# Patient Record
Sex: Female | Born: 1950 | Race: Black or African American | Hispanic: No | Marital: Married | Smoking: Never smoker
Health system: Southern US, Community
[De-identification: ages and names within clinical notes are randomized; demographics above are authoritative.]

## PROBLEM LIST (undated history)

## (undated) DIAGNOSIS — R011 Cardiac murmur, unspecified: Secondary | ICD-10-CM

## (undated) DIAGNOSIS — E7800 Pure hypercholesterolemia, unspecified: Secondary | ICD-10-CM

## (undated) DIAGNOSIS — J189 Pneumonia, unspecified organism: Secondary | ICD-10-CM

## (undated) DIAGNOSIS — D649 Anemia, unspecified: Secondary | ICD-10-CM

## (undated) DIAGNOSIS — E119 Type 2 diabetes mellitus without complications: Secondary | ICD-10-CM

## (undated) DIAGNOSIS — Z87442 Personal history of urinary calculi: Secondary | ICD-10-CM

## (undated) DIAGNOSIS — Z923 Personal history of irradiation: Secondary | ICD-10-CM

## (undated) HISTORY — PX: OVARIAN CYST REMOVAL: SHX89

---

## 1998-04-13 ENCOUNTER — Ambulatory Visit (HOSPITAL_COMMUNITY): Admission: RE | Admit: 1998-04-13 | Discharge: 1998-04-13 | Payer: Self-pay | Admitting: Family Medicine

## 2014-05-07 ENCOUNTER — Ambulatory Visit (INDEPENDENT_AMBULATORY_CARE_PROVIDER_SITE_OTHER): Payer: Self-pay | Admitting: Family Medicine

## 2014-05-07 VITALS — BP 118/72 | HR 79 | Temp 98.2°F | Resp 18 | Ht 63.0 in | Wt 189.8 lb

## 2014-05-07 DIAGNOSIS — J039 Acute tonsillitis, unspecified: Secondary | ICD-10-CM

## 2014-05-07 DIAGNOSIS — J029 Acute pharyngitis, unspecified: Secondary | ICD-10-CM

## 2014-05-07 MED ORDER — HYDROCODONE-ACETAMINOPHEN 7.5-325 MG/15ML PO SOLN
10.0000 mL | Freq: Four times a day (QID) | ORAL | Status: DC | PRN
Start: 2014-05-07 — End: 2020-04-09

## 2014-05-07 MED ORDER — AMOXICILLIN-POT CLAVULANATE 875-125 MG PO TABS: 1.0000 | ORAL_TABLET | Freq: Two times a day (BID) | ORAL | Status: DC

## 2014-05-07 NOTE — Progress Notes (Signed)
Subjective:    Patient ID: Nancy Alvarado, female    DOB: May 28, 1951, 63 y.o.   MRN: [id number]  Sore Throat  Associated symptoms include coughing. Pertinent negatives include no congestion or ear pain.  Cough Associated symptoms include a sore throat. Pertinent negatives include no chills, ear pain, fever or wheezing.   Chief Complaint  Patient presents with  . Sore Throat    Feels like its closing and she can barely swallow, since friday  . Cough    productive, yellowish color   This chart was scribed for Delman Cheadle, MD by Thea Alken, ED Scribe. This patient was seen in room 12 and the patient's care was started at 3:17 PM.  HPI Comments: Nancy Alvarado is a 63 y.o. female who presents to the [facility] complaining of productive cough consisting of yellow sputum and sore throat for the past 2 days. Pt reports symptoms began 2 days ago with a sore throat. She reports pain with swallowing but has been able to drink plenty of fluids. She reports she h tried OTC zicam once without relief to sore throat. She also drank lemon water this morning.  Pt reports she had her grandchildren for 2 weeks but denies them being sick. Pt denies feeling sick. She denies fever, chills, congestion Pt denies hx of strep, mono, and asthma. Pt denies being allergic to abx. Pt does not have health insurance.    History reviewed. No pertinent past medical history. History reviewed. No pertinent past surgical history. Prior to Admission medications   Not on File   Review of Systems  Constitutional: Negative for fever and chills.  HENT: Positive for sore throat. Negative for congestion and ear pain.   Respiratory: Positive for cough. Negative for wheezing.    Objective:   Physical Exam  Nursing note and vitals reviewed. Constitutional: She is oriented to person, place, and time. She appears well-developed and well-nourished. No distress.  HENT:  Head: Normocephalic and  atraumatic.  Nose: Mucosal edema and rhinorrhea present.  Mouth/Throat: Oropharyngeal exudate, posterior oropharyngeal edema and posterior oropharyngeal erythema present.  3+ tonsils   Eyes: Conjunctivae and EOM are normal.  Neck: Neck supple.  Cardiovascular: Normal rate, regular rhythm and normal heart sounds.   Pulmonary/Chest: Effort normal.  Musculoskeletal: Normal range of motion.  Lymphadenopathy:       Head (right side): No preauricular and no posterior auricular adenopathy present.       Head (left side): No preauricular and no posterior auricular adenopathy present.  Right thyroid lobe enlarged vs adenopathy with severe right more than left submandibular and anterior cervical adenopathy.  Neurological: She is alert and oriented to person, place, and time.  Skin: Skin is warm and dry.  Psychiatric: She has a normal mood and affect. Her behavior is normal.    Filed Vitals:   05/07/14 1502  BP: 118/72  Pulse: 79  Temp: 98.2 F (36.8 C)  Resp: 18     Assessment & Plan:   1. Acute pharyngitis, unspecified pharyngitis type   2. Acute tonsillitis    Meds ordered this encounter  Medications  . amoxicillin-clavulanate (AUGMENTIN) 875-125 MG per tablet    Sig: Take 1 tablet by mouth 2 (two) times daily.    Dispense:  20 tablet    Refill:  0  . HYDROcodone-acetaminophen (HYCET) 7.5-325 mg/15 ml solution    Sig: Take 10-15 mLs by mouth every 6 (six) hours as needed.    Dispense:  140 mL    Refill:  0     Pt has been prescribed Augmentin, pain medication. She has been advised to do salt water gargles every to hours. She is to return if symptoms worsen.    Delman Cheadle, MD MPH

## 2014-05-07 NOTE — Patient Instructions (Signed)

## 2015-01-24 ENCOUNTER — Ambulatory Visit (INDEPENDENT_AMBULATORY_CARE_PROVIDER_SITE_OTHER): Payer: Medicare Other

## 2015-01-24 ENCOUNTER — Ambulatory Visit (INDEPENDENT_AMBULATORY_CARE_PROVIDER_SITE_OTHER): Payer: Medicare Other | Admitting: Emergency Medicine

## 2015-01-24 VITALS — BP 120/80 | HR 89 | Temp 97.9°F | Resp 18 | Ht 64.0 in | Wt 186.0 lb

## 2015-01-24 DIAGNOSIS — R05 Cough: Secondary | ICD-10-CM

## 2015-01-24 DIAGNOSIS — R059 Cough, unspecified: Secondary | ICD-10-CM

## 2015-01-24 LAB — POCT CBC
Granulocyte percent: 70.4 %G (ref 37–80)
HCT, POC: 37.8 % (ref 37.7–47.9)
Hemoglobin: 11.9 g/dL — AB (ref 12.2–16.2)
Lymph, poc: 2 (ref 0.6–3.4)
MCH, POC: 24.3 pg — AB (ref 27–31.2)
MCHC: 31.5 g/dL — AB (ref 31.8–35.4)
MCV: 77.4 fL — AB (ref 80–97)
MID (cbc): 0.6 (ref 0–0.9)
MPV: 7.2 fL (ref 0–99.8)
POC Granulocyte: 6.2 (ref 2–6.9)
POC LYMPH PERCENT: 23 %L (ref 10–50)
POC MID %: 6.6 %M (ref 0–12)
Platelet Count, POC: 324 10*3/uL (ref 142–424)
RBC: 4.89 M/uL (ref 4.04–5.48)
RDW, POC: 17.8 %
WBC: 8.8 10*3/uL (ref 4.6–10.2)

## 2015-01-24 MED ORDER — BENZONATATE 100 MG PO CAPS: 100.0000 mg | ORAL_CAPSULE | Freq: Three times a day (TID) | ORAL | Status: DC | PRN

## 2015-01-24 NOTE — Patient Instructions (Signed)
Cough, Adult  A cough is a reflex that helps clear your throat and airways. It can help heal the body or may be a reaction to an irritated airway. A cough may only last 2 or 3 weeks (acute) or may last more than 8 weeks (chronic).  CAUSES Acute cough:  Viral or bacterial infections. Chronic cough:  Infections.  Allergies.  Asthma.  Post-nasal drip.  Smoking.  Heartburn or acid reflux.  Some medicines.  Chronic lung problems (COPD).  Cancer. SYMPTOMS   Cough.  Fever.  Chest pain.  Increased breathing rate.  High-pitched whistling sound when breathing (wheezing).  Colored mucus that you cough up (sputum). TREATMENT   A bacterial cough may be treated with antibiotic medicine.  A viral cough must run its course and will not respond to antibiotics.  Your caregiver may recommend other treatments if you have a chronic cough. HOME CARE INSTRUCTIONS   Only take over-the-counter or prescription medicines for pain, discomfort, or fever as directed by your caregiver. Use cough suppressants only as directed by your caregiver.  Use a cold steam vaporizer or humidifier in your bedroom or home to help loosen secretions.  Sleep in a semi-upright position if your cough is worse at night.  Rest as needed.  Stop smoking if you smoke. SEEK IMMEDIATE MEDICAL CARE IF:   You have pus in your sputum.  Your cough starts to worsen.  You cannot control your cough with suppressants and are losing sleep.  You begin coughing up blood.  You have difficulty breathing.  You develop pain which is getting worse or is uncontrolled with medicine.  You have a fever. MAKE SURE YOU:   Understand these instructions.  Will watch your condition.  Will get help right away if you are not doing well or get worse. Document Released: 03/07/2011 Document Revised: 12/01/2011 Document Reviewed: 03/07/2011 ExitCare Patient Information 2015 [employer]. This information is not intended  to replace advice given to you by your health care provider. Make sure you discuss any questions you have with your health care provider.  

## 2015-01-24 NOTE — Progress Notes (Addendum)
   Subjective:    Patient ID: Nancy Alvarado, female    DOB: 1951-05-08, 64 y.o.   MRN: [id number] This chart was scribed for Arlyss Queen, MD by Zola Button, Medical Scribe. This patient was seen in room 13 and the patient's care was started at 11:33 AM.   HPI HPI Comments: Nancy Alvarado is a 64 y.o. female who presents to the [facility] complaining of gradual onset URI symptoms that started 1 week ago. Patient reports having lingering cough, headache, fatigue, and occasional dizziness. She attributes the dizziness to not eating enough food. She recently went to a conference in [place], [place] last week from 4/26 - 5/1. She started feeling sick after she got to [place], the day after her flight. Per patient, there were only 17 people on her flight and nobody was sick. Her cough was initially productive, but is now dry. Her symptoms have improved overall, but she still has a lingering cough. She has been taking robitussin for her symptoms. Patient denies myalgias, sore throat, wheezing and chest tightness. She also denies hx of asthma. She states she has had pneumonia about 3 times.    Review of Systems  Constitutional: Positive for fatigue.  HENT: Negative for sore throat.   Respiratory: Positive for cough. Negative for chest tightness and wheezing.   Musculoskeletal: Negative for myalgias.  Neurological: Positive for headaches.       Objective:   Physical Exam CONSTITUTIONAL: Well developed/well nourished HEAD: Normocephalic/atraumatic EYES: EOM/PERRL ENMT: Mucous membranes moist NECK: supple no meningeal signs SPINE: entire spine nontender CV: S1/S2 noted, no murmurs/rubs/gallops noted LUNGS: Upper chest rhonchi. No crackles heard. No areas of dullness. ABDOMEN: soft, nontender, no rebound or guarding GU: no cva tenderness NEURO: Pt is awake/alert, moves all extremitiesx4 EXTREMITIES: pulses normal, full ROM SKIN: warm, color normal PSYCH: no abnormalities of  mood noted Results for orders placed or performed in visit on 01/24/15  POCT CBC  Result Value Ref Range   WBC 8.8 4.6 - 10.2 K/uL   Lymph, poc 2.0 0.6 - 3.4   POC LYMPH PERCENT 23.0 10 - 50 %L   MID (cbc) 0.6 0 - 0.9   POC MID % 6.6 0 - 12 %M   POC Granulocyte 6.2 2 - 6.9   Granulocyte percent 70.4 37 - 80 %G   RBC 4.89 4.04 - 5.48 M/uL   Hemoglobin 11.9 (A) 12.2 - 16.2 g/dL   HCT, POC 37.8 37.7 - 47.9 %   MCV 77.4 (A) 80 - 97 fL   MCH, POC 24.3 (A) 27 - 31.2 pg   MCHC 31.5 (A) 31.8 - 35.4 g/dL   RDW, POC 17.8 %   Platelet Count, POC 324 142 - 424 K/uL   MPV 7.2 0 - 99.8 fL  [facility] reading (PRIMARY) by  Dr. Everlene Farrier no acute disease        Assessment & Plan:   CBC is normal chest x-ray does not show pneumonia we'll treat symptomatically with Tessalon Perles and Mucinex.I pShe is borderline anemic indices are microcytic. She has never had a colonoscopy. She would not let me make an appointment for that. She stated she would make an appointment for a physical.ersonally performed the services described in this documentation, which was scribed in my presence. The recorded information has been reviewed and is accurate. }

## 2019-08-29 ENCOUNTER — Other Ambulatory Visit: Payer: Self-pay | Admitting: Family Medicine

## 2019-08-29 DIAGNOSIS — Z1231 Encounter for screening mammogram for malignant neoplasm of breast: Secondary | ICD-10-CM

## 2019-10-19 ENCOUNTER — Ambulatory Visit: Payer: Self-pay

## 2019-11-26 ENCOUNTER — Ambulatory Visit: Payer: Medicare Other | Attending: Internal Medicine

## 2019-11-26 DIAGNOSIS — Z23 Encounter for immunization: Secondary | ICD-10-CM | POA: Insufficient documentation

## 2019-11-26 NOTE — Progress Notes (Signed)
   Covid-19 Vaccination Clinic  Name:  Nancy Alvarado    MRN: [id number] DOB: 06-29-1951  11/26/2019  Nancy Alvarado was observed post Covid-19 immunization for 15 minutes without incident. She was provided with Vaccine Information Sheet and instruction to access the V-Safe system.   Nancy Alvarado was instructed to call 911 with any severe reactions post vaccine: Marland Kitchen Difficulty breathing  . Swelling of face and throat  . A fast heartbeat  . A bad rash all over body  . Dizziness and weakness   Immunizations Administered    Name Date Dose VIS Date Route   Pfizer COVID-19 Vaccine 11/26/2019 12:24 PM 0.3 mL 09/02/2019 Intramuscular   Manufacturer: [place]   Lot: [id number]   [place]: [id number]

## 2019-12-17 ENCOUNTER — Ambulatory Visit: Payer: Medicare Other | Attending: Internal Medicine

## 2019-12-17 DIAGNOSIS — Z23 Encounter for immunization: Secondary | ICD-10-CM

## 2019-12-17 NOTE — Progress Notes (Signed)
   Covid-19 Vaccination Clinic  Name:  Nancy Alvarado    MRN: [id number] DOB: [birth year]  12/17/2019  [name] was observed post Covid-19 immunization for 15 minutes without incident. She was provided with Vaccine Information Sheet and instruction to access the V-Safe system.   [name] was instructed to call 911 with any severe reactions post vaccine: Marland Kitchen Difficulty breathing  . Swelling of face and throat  . A fast heartbeat  . A bad rash all over body  . Dizziness and weakness   Immunizations Administered    Name Date Dose VIS Date Route   Pfizer COVID-19 Vaccine 12/17/2019  1:14 PM 0.3 mL 09/02/2019 Intramuscular   Manufacturer: [place]   Lot: [id number]   [place]: [id number]

## 2019-12-29 ENCOUNTER — Ambulatory Visit: Payer: Medicare Other

## 2020-02-02 ENCOUNTER — Other Ambulatory Visit: Payer: Self-pay

## 2020-02-02 ENCOUNTER — Ambulatory Visit
Admission: RE | Admit: 2020-02-02 | Discharge: 2020-02-02 | Disposition: A | Discharge status: Home / Self Care | Payer: Medicare Other | Source: Ambulatory Visit | Attending: Family Medicine | Admitting: Family Medicine

## 2020-02-02 DIAGNOSIS — Z1231 Encounter for screening mammogram for malignant neoplasm of breast: Secondary | ICD-10-CM

## 2020-02-03 ENCOUNTER — Other Ambulatory Visit: Payer: Self-pay | Admitting: Family Medicine

## 2020-02-03 DIAGNOSIS — R928 Other abnormal and inconclusive findings on diagnostic imaging of breast: Secondary | ICD-10-CM

## 2020-02-16 ENCOUNTER — Other Ambulatory Visit: Payer: Self-pay | Admitting: Family Medicine

## 2020-02-16 ENCOUNTER — Ambulatory Visit
Admission: RE | Admit: 2020-02-16 | Discharge: 2020-02-16 | Disposition: A | Discharge status: Home / Self Care | Payer: Medicare Other | Source: Ambulatory Visit | Attending: Family Medicine | Admitting: Family Medicine

## 2020-02-16 ENCOUNTER — Other Ambulatory Visit: Payer: Self-pay

## 2020-02-16 DIAGNOSIS — R928 Other abnormal and inconclusive findings on diagnostic imaging of breast: Secondary | ICD-10-CM

## 2020-02-16 DIAGNOSIS — R921 Mammographic calcification found on diagnostic imaging of breast: Secondary | ICD-10-CM

## 2020-02-21 DIAGNOSIS — C50919 Malignant neoplasm of unspecified site of unspecified female breast: Secondary | ICD-10-CM

## 2020-02-21 HISTORY — DX: Malignant neoplasm of unspecified site of unspecified female breast: C50.919

## 2020-03-08 ENCOUNTER — Ambulatory Visit
Admission: RE | Admit: 2020-03-08 | Discharge: 2020-03-08 | Disposition: A | Discharge status: Home / Self Care | Payer: Medicare Other | Source: Ambulatory Visit | Attending: Family Medicine | Admitting: Family Medicine

## 2020-03-08 ENCOUNTER — Other Ambulatory Visit: Payer: Self-pay

## 2020-03-08 DIAGNOSIS — R921 Mammographic calcification found on diagnostic imaging of breast: Secondary | ICD-10-CM

## 2020-03-08 DIAGNOSIS — R928 Other abnormal and inconclusive findings on diagnostic imaging of breast: Secondary | ICD-10-CM

## 2020-04-04 ENCOUNTER — Telehealth: Payer: Self-pay | Admitting: Hematology and Oncology

## 2020-04-04 NOTE — Telephone Encounter (Signed)
Received a new pt referral from Dr. Lucia Gaskins for dx of breast cancer. Nancy Alvarado has been cld and scheduled to see Dr. Lindi Adie on 7/19 at 345pm. Pt aware to arrive 15 minutes early.

## 2020-04-08 NOTE — Progress Notes (Signed)
[facility] CONSULT NOTE  Patient Care Team: Patient, No Pcp Per as PCP - General (General Practice)  CHIEF COMPLAINTS/PURPOSE OF CONSULTATION:  Newly diagnosed left breast DCIS  HISTORY OF PRESENTING ILLNESS:  Nancy Alvarado 69 y.o. female is here because of recent diagnosis of left breast DCIS. Screening mammogram on 02/02/20 detected left breast calcifications. Diagnostic mammogram on 02/16/20 showed a 7.4cm group of calcifications in the upper outer left breast. 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 She presents to the clinic today for initial evaluation and discussion of treatment options.  She is accompanied today by her husband.  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  I reviewed her records extensively and collaborated the history with the patient.  MEDICAL HISTORY:  Diabetes and hypercholesterolemia  SURGICAL HISTORY: Past Surgical History:  Procedure Laterality Date  . OVARIAN CYST REMOVAL      SOCIAL HISTORY: Denies any tobacco or alcohol or recreational drug use FAMILY HISTORY: Family History  Problem Relation Age of Onset  . Cancer Mother   . Diabetes Mother   . Heart disease Mother   . Diabetes Father   . Heart disease Father   . Hypertension Sister   . Diabetes Brother   . Hypertension Brother   . Stroke Brother     ALLERGIES:  has No Known Allergies.  MEDICATIONS:  Current Outpatient Medications  Medication Sig Dispense Refill  . metFORMIN (GLUCOPHAGE) 500 MG tablet Take 1 tablet (500 mg total) by mouth daily with breakfast.     No current facility-administered medications for this visit.    REVIEW OF SYSTEMS:   Constitutional: Denies fevers, chills or abnormal night sweats Eyes: Denies blurriness of vision, double vision or watery eyes Ears, nose, mouth, throat, and face: Denies mucositis or sore throat Respiratory: Denies cough, dyspnea or  wheezes Cardiovascular: Denies palpitation, chest discomfort or lower extremity swelling Gastrointestinal:  Denies nausea, heartburn or change in bowel habits Skin: Denies abnormal skin rashes Lymphatics: Denies new lymphadenopathy or easy bruising Neurological:Denies numbness, tingling or new weaknesses Behavioral/Psych: Mood is stable, no new changes  Breast: Denies any palpable lumps or discharge All other systems were reviewed with the patient and are negative.  PHYSICAL EXAMINATION: ECOG PERFORMANCE STATUS: 1 - Symptomatic but completely ambulatory  Vitals:   04/09/20 1520  BP: (!) 142/64  Pulse: 71  Resp: 20  Temp: 98.5 F (36.9 C)  SpO2: 100%   Filed Weights   04/09/20 1520  Weight: 172 lb 8 oz (78.2 kg)    GENERAL:alert, no distress and comfortable SKIN: skin color, texture, turgor are normal, no rashes or significant lesions EYES: normal, conjunctiva are pink and non-injected, sclera clear OROPHARYNX:no exudate, no erythema and lips, buccal mucosa, and tongue normal  NECK: supple, thyroid normal size, non-tender, without nodularity LYMPH:  no palpable lymphadenopathy in the cervical, axillary or inguinal LUNGS: clear to auscultation and percussion with normal breathing effort HEART: regular rate & rhythm and no murmurs and no lower extremity edema ABDOMEN:abdomen soft, non-tender and normal bowel sounds Musculoskeletal:no cyanosis of digits and no clubbing  PSYCH: alert & oriented x 3 with fluent speech NEURO: no focal motor/sensory deficits    LABORATORY DATA:  I have reviewed the data as listed Lab Results  Component Value Date   WBC 8.8 01/24/2015   HGB 11.9 (A) 01/24/2015   HCT 37.8 01/24/2015   MCV 77.4 (A) 01/24/2015   No results  found for: NA, K, CL, CO2  RADIOGRAPHIC STUDIES: I have personally reviewed the radiological reports and agreed with the findings in the report.  ASSESSMENT AND PLAN:  Ductal carcinoma in situ (DCIS) of left  breast Screening mammogram detected Left breast calcs 7.4 cm UOQ, biopsy (anterior and posterior) revealed IG DCIS ER 100%, PR 0% Tis Nx Stage 0  Pathology review: I discussed with the patient the difference between DCIS and invasive breast cancer. It is considered a precancerous lesion. DCIS is classified as a 0. It is generally detected through mammograms as calcifications. We discussed the significance of grades and its impact on prognosis. We also discussed the importance of ER and PR receptors and their implications to adjuvant treatment options. Prognosis of DCIS dependence on grade, comedo necrosis. It is anticipated that if not treated, 20-30% of DCIS can develop into invasive breast cancer.  Recommendation: 1. Mastectomy versus breast conserving surgery 2. adjuvant radiation if she undergoes breast conserving surgery  3. followed by antiestrogen therapy with tamoxifen 5 years  Tamoxifen counseling: We discussed the risks and benefits of tamoxifen. These include but not limited to insomnia, hot flashes, mood changes, vaginal dryness, and weight gain. Although rare, serious side effects including endometrial cancer, risk of blood clots were also discussed. We strongly believe that the benefits far outweigh the risks. Patient understands these risks and consented to starting treatment. Planned treatment duration is 5 years.  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 I sent a message to Dr. Lucia Gaskins to schedule her surgery after that.  Return to clinic after surgery to discuss the final pathology report and come up with an adjuvant treatment plan.     All questions were answered. The patient knows to call the clinic with any problems, questions or concerns.   Rulon Eisenmenger, MD, MPH 04/09/2020    I, Molly Dorshimer, am acting as scribe for Nicholas Lose, MD.  I have reviewed the above documentation for accuracy and completeness, and I agree with the  above.

## 2020-04-09 ENCOUNTER — Inpatient Hospital Stay: Payer: Medicare Other | Attending: Hematology and Oncology | Admitting: Hematology and Oncology

## 2020-04-09 ENCOUNTER — Other Ambulatory Visit: Payer: Self-pay

## 2020-04-09 DIAGNOSIS — Z170 Estrogen receptor positive status [ER+]: Secondary | ICD-10-CM | POA: Insufficient documentation

## 2020-04-09 DIAGNOSIS — Z809 Family history of malignant neoplasm, unspecified: Secondary | ICD-10-CM | POA: Diagnosis not present

## 2020-04-09 DIAGNOSIS — D0512 Intraductal carcinoma in situ of left breast: Secondary | ICD-10-CM | POA: Insufficient documentation

## 2020-04-09 MED ORDER — METFORMIN HCL 500 MG PO TABS: 500.0000 mg | ORAL_TABLET | Freq: Every day | ORAL | Status: AC

## 2020-04-09 NOTE — Assessment & Plan Note (Signed)
Screening mammogram detected Left breast calcs 7.4 cm UOQ, biopsy (anterior and posterior) revealed IG DCIS ER 100%, PR 0% Tis Nx Stage 0  Pathology review: I discussed with the patient the difference between DCIS and invasive breast cancer. It is considered a precancerous lesion. DCIS is classified as a 0. It is generally detected through mammograms as calcifications. We discussed the significance of grades and its impact on prognosis. We also discussed the importance of ER and PR receptors and their implications to adjuvant treatment options. Prognosis of DCIS dependence on grade, comedo necrosis. It is anticipated that if not treated, 20-30% of DCIS can develop into invasive breast cancer.  Recommendation: 1. Mastectomy 2. Followed by antiestrogen therapy with tamoxifen 5 years  Tamoxifen counseling: We discussed the risks and benefits of tamoxifen. These include but not limited to insomnia, hot flashes, mood changes, vaginal dryness, and weight gain. Although rare, serious side effects including endometrial cancer, risk of blood clots were also discussed. We strongly believe that the benefits far outweigh the risks. Patient understands these risks and consented to starting treatment. Planned treatment duration is 5 years.  Return to clinic after surgery to discuss the final pathology report and come up with an adjuvant treatment plan.

## 2020-04-10 ENCOUNTER — Encounter: Payer: Self-pay | Admitting: *Deleted

## 2020-04-10 ENCOUNTER — Telehealth: Payer: Self-pay | Admitting: *Deleted

## 2020-04-10 ENCOUNTER — Telehealth: Payer: Self-pay | Admitting: Hematology and Oncology

## 2020-04-10 NOTE — Telephone Encounter (Signed)
No 7/19 los, no changes made to pt schedule   

## 2020-04-10 NOTE — Telephone Encounter (Signed)
Called and spoke with patient to discuss navigation resources and give contact information.  She denies any needs or concerns at this time but encouraged her to call if anything should arise. Patient verbalized understanding.

## 2020-04-11 NOTE — Progress Notes (Signed)
Location of Breast Cancer: UOQ Left Breast  Did patient present with symptoms (if so, please note symptoms) or was this found on screening mammography?: Routine mammogram  Diagnostic mammogram 02/16/20: 7.4cm group of calcifications in the upper outer left breast.  Histology per Pathology Report: Left Breast UOQ 03/08/2020   Receptor Status: ER(+ 100%), PR (- 0%), Her2-neu (), Ki-()   Past/Anticipated interventions by surgeon, if any: - Surgery unscheduled at this time.   Past/Anticipated interventions by medical oncology, if any: Chemotherapy  Dr. Lindi Adie 04/09/2020 Recommendation: 1. Mastectomy versus breast conserving surgery 2. adjuvant radiation if she undergoes breast conserving surgery  3. 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 I sent a message to Dr. Lucia Gaskins to schedule her surgery after that. -Return to clinic after surgery to discuss the final pathology report and come up with an adjuvant treatment plan.  Lymphedema issues, if any:  No  Pain issues, if any:  No  SAFETY ISSUES:  Prior radiation? No  Pacemaker/ICD? No  Possible current pregnancy? Postmenopausal  Is the patient on methotrexate? No  Current Complaints / other details:   -Takes Metformin     Cori Razor, RN 04/11/2020,3:13 PM

## 2020-04-12 ENCOUNTER — Ambulatory Visit
Admission: RE | Admit: 2020-04-12 | Discharge: 2020-04-12 | Disposition: A | Discharge status: Home / Self Care | Payer: Medicare Other | Source: Ambulatory Visit | Attending: Radiation Oncology | Admitting: Radiation Oncology

## 2020-04-12 ENCOUNTER — Other Ambulatory Visit: Payer: Self-pay

## 2020-04-12 ENCOUNTER — Encounter: Payer: Self-pay | Admitting: Radiation Oncology

## 2020-04-12 VITALS — Ht 64.0 in | Wt 172.0 lb

## 2020-04-12 DIAGNOSIS — D0512 Intraductal carcinoma in situ of left breast: Secondary | ICD-10-CM

## 2020-04-12 NOTE — Progress Notes (Signed)
Radiation Oncology         [phone] ________________________________  Initial Outpatient Consultation - Conducted via telephone due to current COVID-19 concerns for limiting patient exposure  I spoke with the patient to conduct this consult visit via telephone to spare the patient unnecessary potential exposure in the healthcare setting during the current COVID-19 pandemic. The patient was notified in advance and was offered a [place] meeting to allow for face to face communication but unfortunately reported that they did not have the appropriate resources/technology to support such a visit and instead preferred to proceed with a telephone consult.   Name: Nancy Alvarado        MRN: [id number]  Date of Service: 04/12/2020 DOB: 1951/04/14  [id number], No Pcp Per  Alphonsa Overall, MD     REFERRING PHYSICIAN: Alphonsa Overall, MD   DIAGNOSIS: The encounter diagnosis was Ductal carcinoma in situ (DCIS) of left breast.   HISTORY OF PRESENT ILLNESS: Nancy Alvarado is a 69 y.o. female seen for a new diagnosis of left breast cancer. The patient was noted to have a screening detected abnormality in the left breast. This was followed by diagnostic imaging, and mammographically revealed a grouping of calcifications in the left breast measuring 7.4 cm in greatest dimension in the upper outer quadrant.  No mass was otherwise identified and it was recommended that she undergo a stereotactic biopsy which was performed on 03/08/2020 final pathology revealed calcifications both in the anterior and posterior specimens consistent with intermediate grade DCIS.  Her tumor was ER positive/PR negative.  She met with Dr. Lucia Gaskins and has been contemplating surgical intervention.  Apparently the patient has large breasts, and is interested in trying to still proceed with breast conservation.  She is scheduled to undergo lumpectomy on 05/15/2020 with Dr. Lucia Gaskins, she also met with Dr. Lindi Adie, and is contacted today by  phone to discuss options of adjuvant therapy.     PREVIOUS RADIATION THERAPY: No   PAST MEDICAL HISTORY:  Past Medical History:  Diagnosis Date  . Breast cancer ([place]) 02/2020   Left Breast       PAST SURGICAL HISTORY: Past Surgical History:  Procedure Laterality Date  . OVARIAN CYST REMOVAL       FAMILY HISTORY:  Family History  Problem Relation Age of Onset  . Cancer Mother   . Diabetes Mother   . Heart disease Mother   . Diabetes Father   . Heart disease Father   . Hypertension Sister   . Diabetes Brother   . Hypertension Brother   . Stroke Brother      SOCIAL HISTORY:  reports that she has never smoked. She has never used smokeless tobacco. She reports that she does not drink alcohol and does not use drugs. 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   ALLERGIES: Patient has no known allergies.   MEDICATIONS:  Current Outpatient Medications  Medication Sig Dispense Refill  . atorvastatin (LIPITOR) 20 MG tablet Take 20 mg by mouth daily.    . metFORMIN (GLUCOPHAGE) 500 MG tablet Take 1 tablet (500 mg total) by mouth daily with breakfast.     No current facility-administered medications for this encounter.     REVIEW OF SYSTEMS: On review of systems, the patient reports that she is doing well overall. No specific complaints are noted.     PHYSICAL EXAM:  Wt Readings from Last 3 Encounters:  04/12/20 172 lb (78 kg)  04/09/20 172 lb  8 oz (78.2 kg)  01/24/15 186 lb (84.4 kg)   Unable to assess given encounter type  ECOG = 0  0 - Asymptomatic (Fully active, able to carry on all predisease activities without restriction)  1 - Symptomatic but completely ambulatory (Restricted in physically strenuous activity but ambulatory and able to carry out work of a light or sedentary nature. For example, light housework, office work)  2 - Symptomatic, <50% in bed during the day (Ambulatory and capable of all self care but unable to  carry out any work activities. Up and about more than 50% of waking hours)  3 - Symptomatic, >50% in bed, but not bedbound (Capable of only limited self-care, confined to bed or chair 50% or more of waking hours)  4 - Bedbound (Completely disabled. Cannot carry on any self-care. Totally confined to bed or chair)  5 - Death   Nancy Alvarado, Nancy Alvarado, Nancy Alvarado, et al. [phone]). "Toxicity and response criteria of the [facility]". [place] Oncol. 5 (6): 649-55    LABORATORY DATA:  Lab Results  Component Value Date   WBC 8.8 01/24/2015   HGB 11.9 (A) 01/24/2015   HCT 37.8 01/24/2015   MCV 77.4 (A) 01/24/2015   No results found for: NA, K, CL, CO2 No results found for: ALT, AST, GGT, ALKPHOS, BILITOT    RADIOGRAPHY: No results found.     IMPRESSION/PLAN: 1. Intermediate grade, ER/PR positive DCIS of the left breast. Dr. Lisbeth Alvarado discusses the pathology findings and reviews the nature of noninvasive breast disease.  Dr. Lisbeth Alvarado discusses the findings, and reviews the rationale that in patients who undergo breast conservation surgery, that they should be offered adjuvant radiotherapy as a way to reduce the risks of local recurrence.  He also discusses that in her situation, if she did have mastectomy, there would likely not be a role for radiotherapy.  We discussed the course of external radiotherapy to the breast followed by antiestrogen therapy. We discussed the risks, benefits, short, and long term effects of radiotherapy, and the patient is interested in proceeding if she is able to continue with breast conservation surgery. Dr. Lisbeth Alvarado discusses the delivery and logistics of radiotherapy and anticipates a course of 4 weeks of radiotherapy if she undergoes lumpectomy. We will see her back about 2 weeks after surgery to discuss the simulation process and anticipate we starting radiotherapy about 4-6 weeks after surgery.    Given current concerns for patient exposure during  the COVID-19 pandemic, this encounter was conducted via telephone.  The patient has provided two factor identification and has given verbal consent for this type of encounter and has been advised to only accept a meeting of this type in a secure network environment. The time spent during this encounter was 45 minutes including preparation, discussion, and coordination of the patient's care. The attendants for this meeting include Blenda Nicely, RN, Dr. Lisbeth Alvarado, Hayden Pedro  and Raynelle Chary.  During the encounter,  Blenda Nicely, RN, Dr. Lisbeth Alvarado, and Hayden Pedro were located at [facility] Radiation Oncology Department.  DEMETRA MOYA was located at home.    The above documentation reflects my direct findings during this shared patient visit. Please see the separate note by Dr. Lisbeth Alvarado on this date for the remainder of the patient's plan of care.    Carola Rhine, PAC

## 2020-04-16 ENCOUNTER — Other Ambulatory Visit: Payer: Self-pay | Admitting: Surgery

## 2020-04-16 DIAGNOSIS — D0592 Unspecified type of carcinoma in situ of left breast: Secondary | ICD-10-CM

## 2020-04-24 ENCOUNTER — Other Ambulatory Visit: Payer: Self-pay | Admitting: Surgery

## 2020-04-24 DIAGNOSIS — D0592 Unspecified type of carcinoma in situ of left breast: Secondary | ICD-10-CM

## 2020-04-25 ENCOUNTER — Encounter: Payer: Self-pay | Admitting: *Deleted

## 2020-04-25 DIAGNOSIS — D0512 Intraductal carcinoma in situ of left breast: Secondary | ICD-10-CM

## 2020-04-30 ENCOUNTER — Telehealth: Payer: Self-pay | Admitting: Hematology and Oncology

## 2020-04-30 ENCOUNTER — Encounter: Payer: Self-pay | Admitting: *Deleted

## 2020-04-30 NOTE — Telephone Encounter (Signed)
Scheduled per 8/9 sch message. Pt is aware of appt time and date. 

## 2020-05-07 ENCOUNTER — Encounter: Payer: Self-pay | Admitting: *Deleted

## 2020-05-18 ENCOUNTER — Encounter (HOSPITAL_BASED_OUTPATIENT_CLINIC_OR_DEPARTMENT_OTHER): Payer: Self-pay | Admitting: Surgery

## 2020-05-18 ENCOUNTER — Other Ambulatory Visit: Payer: Self-pay

## 2020-05-22 ENCOUNTER — Other Ambulatory Visit (HOSPITAL_BASED_OUTPATIENT_CLINIC_OR_DEPARTMENT_OTHER): Payer: Medicare Other

## 2020-05-22 ENCOUNTER — Other Ambulatory Visit (HOSPITAL_COMMUNITY)
Admission: RE | Admit: 2020-05-22 | Discharge: 2020-05-22 | Disposition: A | Discharge status: Home / Self Care | Payer: Medicare Other | Source: Ambulatory Visit | Attending: Surgery | Admitting: Surgery

## 2020-05-22 ENCOUNTER — Encounter (HOSPITAL_BASED_OUTPATIENT_CLINIC_OR_DEPARTMENT_OTHER)
Admission: RE | Admit: 2020-05-22 | Discharge: 2020-05-22 | Disposition: A | Discharge status: Home / Self Care | Payer: Medicare Other | Source: Ambulatory Visit | Attending: Surgery | Admitting: Surgery

## 2020-05-22 DIAGNOSIS — Z20822 Contact with and (suspected) exposure to covid-19: Secondary | ICD-10-CM | POA: Diagnosis not present

## 2020-05-22 DIAGNOSIS — Z01812 Encounter for preprocedural laboratory examination: Secondary | ICD-10-CM | POA: Insufficient documentation

## 2020-05-22 DIAGNOSIS — Z01818 Encounter for other preprocedural examination: Secondary | ICD-10-CM | POA: Insufficient documentation

## 2020-05-22 LAB — BASIC METABOLIC PANEL
Anion gap: 9 (ref 5–15)
BUN: 14 mg/dL (ref 8–23)
CO2: 26 mmol/L (ref 22–32)
Calcium: 8.8 mg/dL — ABNORMAL LOW (ref 8.9–10.3)
Chloride: 105 mmol/L (ref 98–111)
Creatinine, Ser: 0.87 mg/dL (ref 0.44–1.00)
GFR calc Af Amer: >60 mL/min (ref >60)
GFR calc non Af Amer: >60 mL/min (ref >60)
Glucose, Bld: 123 mg/dL — ABNORMAL HIGH (ref 70–99)
Potassium: 3.8 mmol/L (ref 3.5–5.1)
Sodium: 140 mmol/L (ref 135–145)

## 2020-05-22 LAB — SARS CORONAVIRUS 2 (TAT 6-24 HRS): SARS Coronavirus 2: NEGATIVE

## 2020-05-22 MED ORDER — CHLORHEXIDINE GLUCONATE 4 % EX LIQD: 60.0000 mL | Freq: Once | CUTANEOUS | Status: DC

## 2020-05-22 NOTE — Progress Notes (Signed)
      Enhanced Recovery after Surgery for Orthopedics Enhanced Recovery after Surgery is a protocol used to improve the stress on your body and your recovery after surgery.  Patient Instructions  . The night before surgery:  o No food after midnight. ONLY clear liquids after midnight  . The day of surgery (if you do NOT have diabetes):  o Drink ONE (1) Pre-Surgery Clear Ensure as directed.   o This drink was given to you during your hospital  pre-op appointment visit. o The pre-op nurse will instruct you on the time to drink the  Pre-Surgery Ensure depending on your surgery time. o Finish the drink at the designated time by the pre-op nurse.  o Nothing else to drink after completing the  Pre-Surgery Clear Ensure.  . The day of surgery (if you have diabetes): o Drink ONE (1) Gatorade 2 (G2) as directed. o This drink was given to you during your hospital  pre-op appointment visit.  o The pre-op nurse will instruct you on the time to drink the   Gatorade 2 (G2) depending on your surgery time. o Color of the Gatorade may vary. Red is not allowed. o Nothing else to drink after completing the  Gatorade 2 (G2).         If you have questions, please contact your surgeon's office.  Surgical soap given with instructions, pt verbalized understandings.

## 2020-05-24 ENCOUNTER — Other Ambulatory Visit: Payer: Self-pay

## 2020-05-24 ENCOUNTER — Ambulatory Visit
Admission: RE | Admit: 2020-05-24 | Discharge: 2020-05-24 | Disposition: A | Discharge status: Home / Self Care | Payer: Medicare Other | Source: Ambulatory Visit | Attending: Surgery | Admitting: Surgery

## 2020-05-24 DIAGNOSIS — D0592 Unspecified type of carcinoma in situ of left breast: Secondary | ICD-10-CM

## 2020-05-24 NOTE — H&P (Addendum)
Nancy Alvarado  Location: [facility] Patient #: [id number] DOB: 1951/05/01 Married / Language: English / Race: Black or African American Female  History of Present Illness   The patient is a 69 year old female who presents with a complaint of breast cancer.  The PCP is Dr. Merita Norton  She is here with her husband, Windell.  She is not sure when she had her last mammograms. But she said has been several years. She noticed no difference in her left breast prior to the biopsies.  Mammograms: [facility] - 02/16/2020 - 7.4 cm of linearly oriented calcifications Biopsy: Left breast biopsy x 2 - 6/17/202 ([id number]) - 1. UOQ - DCIS, 2 - OUQ, DICS - ER - 100%, PR -0% Family history of breast or ovarian cancer: One daughter died in her 84's of breast cancer On hormone therapy: One daughter died in her 62's of breast cancer  I discussed the options for breast cancer treatment with the patient. The patient is at the [facility], which includes medical oncology and radiation oncology. I discussed the surgical options of lumpectomy vs. mastectomy. If mastectomy, there is the possibility of reconstruction. I discussed the options of lymph node biopsy. The treatment plan depends on the pathologic staging of the tumor and the patient's personal wishes. The risks of surgery include, but are not limited to, bleeding, infection, the need for further surgery, and nerve injury. The patient has been given literature on the treatment of breast cancer.  She was presented at the Breast Cancer Conference - she is a candidate for COMET. But she is not interested in this.  She asked questions about radiation therapy. And it seems like this was problem for her daughter (who died of breast cancer). So I think that it would be good for her to meet the rad oncologist prior to scheduling her surgery.  Plan: 1.  Radiation and medical oncology, 2. Will schedule after you see radiation oncology for left breast lumpectomy with 2 seeds  Past Medical History: 1. Covid vaccine - March 2021 2. DM x 3 years 3. Exploration for molar cyst after first pregnancy - she thought that this was unnecessary 4. Colonoscopy - 2018  Social History: Marred, husband Aliene Beams She has 4 living children. One daughter died in her 42's of breast cancer. 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  Past Surgical History (April Staton, CMA; 03/30/2020 8:32 AM) Breast Biopsy  Left.  Diagnostic Studies History (April Staton, [place]; 03/30/2020 8:32 AM) Colonoscopy  5-10 years ago Mammogram  within last year Pap Smear  1-5 years ago  Allergies (April Staton, [place]; 03/30/2020 8:33 AM) No Known Drug Allergies  [03/30/2020]:  Medication History (April Staton, CMA; 03/30/2020 8:33 AM) Atorvastatin Calcium (20MG  Tablet, Oral) Active. metFORMIN HCl ER (500MG  Tablet ER 24HR, Oral) Active. Iron (325 (65 Fe)MG Tablet, Oral) Active. Medications Reconciled  Pregnancy / Birth History (April Staton, [place]; 03/30/2020 8:32 AM) Age at menarche  58 years. Age of menopause  36-60 Gravida  61 Para  5  Other Problems (April Staton, [place]; 03/30/2020 8:32 AM) Back Pain  Breast Cancer  Diabetes Mellitus     Review of Systems (April Staton CMA; 03/30/2020 8:32 AM) General Not Present- Appetite Loss, Chills, Fatigue, Fever, Night Sweats, Weight Gain and Weight Loss. Skin Not Present- Change in Wart/Mole, Dryness, Hives, Jaundice, New Lesions, Non-Healing Wounds, Rash and Ulcer. HEENT Present- Wears glasses/contact lenses. Not Present- Earache, Hearing Loss, Hoarseness, Nose Bleed, Oral  Ulcers, Ringing in the Ears, Seasonal Allergies, Sinus Pain, Sore Throat, Visual Disturbances and Yellow Eyes. Respiratory Not Present- Bloody sputum, Chronic Cough, Difficulty Breathing, Snoring and Wheezing. Cardiovascular Not  Present- Chest Pain, Difficulty Breathing Lying Down, Leg Cramps, Palpitations, Rapid Heart Rate, Shortness of Breath and Swelling of Extremities. Gastrointestinal Present- Change in Bowel Habits. Not Present- Abdominal Pain, Bloating, Bloody Stool, Chronic diarrhea, Constipation, Difficulty Swallowing, Excessive gas, Gets full quickly at meals, Hemorrhoids, Indigestion, Nausea, Rectal Pain and Vomiting. Female Genitourinary Not Present- Frequency, Nocturia, Painful Urination, Pelvic Pain and Urgency. Musculoskeletal Present- Back Pain. Not Present- Joint Pain, Joint Stiffness, Muscle Pain, Muscle Weakness and Swelling of Extremities. Neurological Not Present- Decreased Memory, Fainting, Headaches, Numbness, Seizures, Tingling, Tremor, Trouble walking and Weakness. Psychiatric Not Present- Anxiety, Bipolar, Change in Sleep Pattern, Depression, Fearful and Frequent crying. Hematology Not Present- Blood Thinners, Easy Bruising, Excessive bleeding, Gland problems, HIV and Persistent Infections.  Vitals (April Staton CMA; 03/30/2020 8:34 AM) 03/30/2020 8:33 AM Weight: 173.25 lb Height: 65in Body Surface Area: 1.86 m Body Mass Index: 28.83 kg/m  Temp.: 97.59F (Temporal)  Pulse: 83 (Regular)  P.OX: 95% (Room air) BP: 138/82(Sitting, Left Arm, Standard)   Physical Exam  General: WN older AA F who is alert and generally healthy appearing. HEENT: Normal. Pupils equal.  Neck: Supple. No mass. No thyroid mass. Lymph Nodes: No supraclavicular or cervical nodes.  Lungs: Clear to auscultation and symmetric breath sounds. Heart: RRR. No murmur or rub.  Breasts: Right - pendulous and moderately large, no mass  Left - pendulous and moderately large, there is a mass effect in the UOQ of the left breast. I am assuming these are from the biopsies done in the left breast  Abdomen: Soft. No mass. No tenderness. No hernia. Normal bowel sounds.  Left lower abdominal scar.  Extremities: Good  strength and ROM in upper and lower extremities.  Neurologic: Grossly intact to motor and sensory function. Psychiatric: Has normal mood and affect. Behavior is normal.   Assessment & Plan  1.  BREAST CANCER, STAGE 0, LEFT (D05.92)  Story: Left breast biopsy x 2 - 6/17/202 ([id number]) - 1. UOQ - DCIS, 2 - OUQ, DICS - ER - 100%, PR -0%  Plan:  1. Radiation and medical oncology   Dr. Lowanda Foster   2. Left breast lumpectomy with 2 seeds (this will be used to bracket the DCIS)  2.  DIABETES MELLITUS (E11.9)     Alphonsa Overall, MD, [facility] Office phone:  [phone]

## 2020-05-25 ENCOUNTER — Ambulatory Visit (HOSPITAL_BASED_OUTPATIENT_CLINIC_OR_DEPARTMENT_OTHER): Payer: Medicare Other | Admitting: Certified Registered Nurse Anesthetist

## 2020-05-25 ENCOUNTER — Ambulatory Visit
Admission: RE | Admit: 2020-05-25 | Discharge: 2020-05-25 | Disposition: A | Discharge status: Home / Self Care | Payer: Medicare Other | Source: Ambulatory Visit | Attending: Surgery | Admitting: Surgery

## 2020-05-25 ENCOUNTER — Encounter (HOSPITAL_BASED_OUTPATIENT_CLINIC_OR_DEPARTMENT_OTHER)
Admission: RE | Disposition: A | Discharge status: Home / Self Care | Payer: Self-pay | Source: Home / Self Care | Attending: Surgery

## 2020-05-25 ENCOUNTER — Encounter (HOSPITAL_BASED_OUTPATIENT_CLINIC_OR_DEPARTMENT_OTHER): Payer: Self-pay | Admitting: Surgery

## 2020-05-25 ENCOUNTER — Ambulatory Visit (HOSPITAL_BASED_OUTPATIENT_CLINIC_OR_DEPARTMENT_OTHER)
Admission: RE | Admit: 2020-05-25 | Discharge: 2020-05-25 | Disposition: A | Discharge status: Home / Self Care | Payer: Medicare Other | Attending: Surgery | Admitting: Surgery

## 2020-05-25 DIAGNOSIS — E119 Type 2 diabetes mellitus without complications: Secondary | ICD-10-CM | POA: Diagnosis not present

## 2020-05-25 DIAGNOSIS — D0592 Unspecified type of carcinoma in situ of left breast: Secondary | ICD-10-CM

## 2020-05-25 DIAGNOSIS — D0512 Intraductal carcinoma in situ of left breast: Secondary | ICD-10-CM | POA: Insufficient documentation

## 2020-05-25 DIAGNOSIS — Z803 Family history of malignant neoplasm of breast: Secondary | ICD-10-CM | POA: Insufficient documentation

## 2020-05-25 DIAGNOSIS — Z7984 Long term (current) use of oral hypoglycemic drugs: Secondary | ICD-10-CM | POA: Diagnosis not present

## 2020-05-25 HISTORY — PX: BREAST LUMPECTOMY WITH RADIOACTIVE SEED LOCALIZATION: SHX6424

## 2020-05-25 HISTORY — DX: Type 2 diabetes mellitus without complications: E11.9

## 2020-05-25 HISTORY — DX: Pure hypercholesterolemia, unspecified: E78.00

## 2020-05-25 LAB — GLUCOSE, CAPILLARY
Glucose-Capillary: 131 mg/dL — ABNORMAL HIGH (ref 70–99)
Glucose-Capillary: 116 mg/dL — ABNORMAL HIGH (ref 70–99)

## 2020-05-25 SURGERY — BREAST LUMPECTOMY WITH RADIOACTIVE SEED LOCALIZATION
Anesthesia: General | Site: Breast | Laterality: Left

## 2020-05-25 MED ORDER — CEFAZOLIN SODIUM-DEXTROSE 2-4 GM/100ML-% IV SOLN
INTRAVENOUS | Status: AC
  Filled 2020-05-25: qty 100

## 2020-05-25 MED ORDER — PHENYLEPHRINE 40 MCG/ML (10ML) SYRINGE FOR IV PUSH (FOR BLOOD PRESSURE SUPPORT)
PREFILLED_SYRINGE | INTRAVENOUS | Status: DC | PRN
  Administered 2020-05-25 (×2): 80 ug via INTRAVENOUS

## 2020-05-25 MED ORDER — TRAMADOL HCL 50 MG PO TABS: 50.0000 mg | ORAL_TABLET | Freq: Four times a day (QID) | ORAL | 0 refills | Status: DC | PRN

## 2020-05-25 MED ORDER — PROPOFOL 10 MG/ML IV BOLUS
INTRAVENOUS | Status: DC | PRN
  Administered 2020-05-25: 120 mg via INTRAVENOUS

## 2020-05-25 MED ORDER — ONDANSETRON HCL 4 MG/2ML IJ SOLN
INTRAMUSCULAR | Status: DC | PRN
  Administered 2020-05-25: 4 mg via INTRAVENOUS

## 2020-05-25 MED ORDER — ACETAMINOPHEN 500 MG PO TABS
1000.0000 mg | ORAL_TABLET | ORAL | Status: AC
  Administered 2020-05-25: 1000 mg via ORAL

## 2020-05-25 MED ORDER — DEXAMETHASONE SODIUM PHOSPHATE 10 MG/ML IJ SOLN
INTRAMUSCULAR | Status: DC | PRN
  Administered 2020-05-25: 10 mg via INTRAVENOUS

## 2020-05-25 MED ORDER — GLYCOPYRROLATE PF 0.2 MG/ML IJ SOSY
PREFILLED_SYRINGE | INTRAMUSCULAR | Status: AC
  Filled 2020-05-25: qty 1

## 2020-05-25 MED ORDER — FENTANYL CITRATE (PF) 100 MCG/2ML IJ SOLN
INTRAMUSCULAR | Status: AC
  Filled 2020-05-25: qty 2

## 2020-05-25 MED ORDER — LACTATED RINGERS IV SOLN: INTRAVENOUS | Status: DC

## 2020-05-25 MED ORDER — FENTANYL CITRATE (PF) 100 MCG/2ML IJ SOLN
INTRAMUSCULAR | Status: DC | PRN
Start: 2020-05-25 — End: 2020-05-25
  Administered 2020-05-25 (×6): 25 ug via INTRAVENOUS
  Administered 2020-05-25: 50 ug via INTRAVENOUS

## 2020-05-25 MED ORDER — LIDOCAINE 2% (20 MG/ML) 5 ML SYRINGE
INTRAMUSCULAR | Status: DC | PRN
  Administered 2020-05-25: 60 mg via INTRAVENOUS

## 2020-05-25 MED ORDER — FENTANYL CITRATE (PF) 100 MCG/2ML IJ SOLN: 25.0000 ug | INTRAMUSCULAR | Status: DC | PRN

## 2020-05-25 MED ORDER — ONDANSETRON HCL 4 MG/2ML IJ SOLN
INTRAMUSCULAR | Status: AC
  Filled 2020-05-25: qty 2

## 2020-05-25 MED ORDER — CEFAZOLIN SODIUM-DEXTROSE 2-4 GM/100ML-% IV SOLN
2.0000 g | INTRAVENOUS | Status: AC
  Administered 2020-05-25: 2 g via INTRAVENOUS

## 2020-05-25 MED ORDER — BUPIVACAINE HCL (PF) 0.25 % IJ SOLN
INTRAMUSCULAR | Status: DC | PRN
  Administered 2020-05-25: 30 mL

## 2020-05-25 MED ORDER — EPHEDRINE 5 MG/ML INJ
INTRAVENOUS | Status: AC
  Filled 2020-05-25: qty 20

## 2020-05-25 MED ORDER — SUCCINYLCHOLINE CHLORIDE 200 MG/10ML IV SOSY
PREFILLED_SYRINGE | INTRAVENOUS | Status: AC
  Filled 2020-05-25: qty 10

## 2020-05-25 MED ORDER — LIDOCAINE 2% (20 MG/ML) 5 ML SYRINGE
INTRAMUSCULAR | Status: AC
  Filled 2020-05-25: qty 5

## 2020-05-25 MED ORDER — ACETAMINOPHEN 500 MG PO TABS
ORAL_TABLET | ORAL | Status: AC
  Filled 2020-05-25: qty 2

## 2020-05-25 MED ORDER — MIDAZOLAM HCL 5 MG/5ML IJ SOLN
INTRAMUSCULAR | Status: DC | PRN
  Administered 2020-05-25: 2 mg via INTRAVENOUS

## 2020-05-25 MED ORDER — DEXAMETHASONE SODIUM PHOSPHATE 10 MG/ML IJ SOLN
INTRAMUSCULAR | Status: AC
  Filled 2020-05-25: qty 1

## 2020-05-25 MED ORDER — MIDAZOLAM HCL 2 MG/2ML IJ SOLN
INTRAMUSCULAR | Status: AC
  Filled 2020-05-25: qty 2

## 2020-05-25 SURGICAL SUPPLY — 55 items
ADH SKN CLS APL DERMABOND .7 (GAUZE/BANDAGES/DRESSINGS) ×1
APL SKNCLS STERI-STRIP NONHPOA (GAUZE/BANDAGES/DRESSINGS)
BENZOIN TINCTURE PRP APPL 2/3 (GAUZE/BANDAGES/DRESSINGS) IMPLANT
BINDER BREAST LRG (GAUZE/BANDAGES/DRESSINGS) IMPLANT
BINDER BREAST MEDIUM (GAUZE/BANDAGES/DRESSINGS) IMPLANT
BINDER BREAST XLRG (GAUZE/BANDAGES/DRESSINGS) ×2 IMPLANT
BINDER BREAST XXLRG (GAUZE/BANDAGES/DRESSINGS) IMPLANT
BLADE HEX COATED 2.75 (ELECTRODE) IMPLANT
BLADE SURG 10 STRL SS (BLADE) IMPLANT
BLADE SURG 15 STRL LF DISP TIS (BLADE) ×1 IMPLANT
BLADE SURG 15 STRL SS (BLADE) ×2
CANISTER SUC SOCK COL 7IN (MISCELLANEOUS) IMPLANT
CANISTER SUCT 1200ML W/VALVE (MISCELLANEOUS) ×2 IMPLANT
CLIP VESOCCLUDE SM WIDE 6/CT (CLIP) ×2 IMPLANT
COVER BACK TABLE 60X90IN (DRAPES) ×2 IMPLANT
COVER MAYO STAND STRL (DRAPES) ×2 IMPLANT
COVER PROBE W GEL 5X96 (DRAPES) ×4 IMPLANT
COVER WAND RF STERILE (DRAPES) IMPLANT
DECANTER SPIKE VIAL GLASS SM (MISCELLANEOUS) IMPLANT
DERMABOND ADVANCED (GAUZE/BANDAGES/DRESSINGS) ×1
DERMABOND ADVANCED .7 DNX12 (GAUZE/BANDAGES/DRESSINGS) ×1 IMPLANT
DRAPE LAPAROSCOPIC ABDOMINAL (DRAPES) ×2 IMPLANT
DRAPE UTILITY XL STRL (DRAPES) ×2 IMPLANT
DRSG PAD ABDOMINAL 8X10 ST (GAUZE/BANDAGES/DRESSINGS) ×2 IMPLANT
DURAPREP 26ML APPLICATOR (WOUND CARE) ×2 IMPLANT
ELECT COATED BLADE 2.86 ST (ELECTRODE) ×2 IMPLANT
ELECT REM PT RETURN 9FT ADLT (ELECTROSURGICAL) ×2
ELECTRODE REM PT RTRN 9FT ADLT (ELECTROSURGICAL) ×1 IMPLANT
GAUZE SPONGE 4X4 12PLY STRL (GAUZE/BANDAGES/DRESSINGS) ×2 IMPLANT
GAUZE SPONGE 4X4 12PLY STRL LF (GAUZE/BANDAGES/DRESSINGS) ×2 IMPLANT
GLOVE BIO SURGEON STRL SZ7 (GLOVE) ×4 IMPLANT
GLOVE BIOGEL PI IND STRL 6.5 (GLOVE) ×2 IMPLANT
GLOVE BIOGEL PI INDICATOR 6.5 (GLOVE) ×2
GLOVE SURG SYN 7.5  E (GLOVE) ×4
GLOVE SURG SYN 7.5 E (GLOVE) ×2 IMPLANT
GOWN STRL REUS W/ TWL LRG LVL3 (GOWN DISPOSABLE) ×2 IMPLANT
GOWN STRL REUS W/ TWL XL LVL3 (GOWN DISPOSABLE) ×1 IMPLANT
GOWN STRL REUS W/TWL LRG LVL3 (GOWN DISPOSABLE) ×4
GOWN STRL REUS W/TWL XL LVL3 (GOWN DISPOSABLE) ×2
KIT MARKER MARGIN INK (KITS) ×2 IMPLANT
NEEDLE HYPO 25X1 1.5 SAFETY (NEEDLE) ×2 IMPLANT
NS IRRIG 1000ML POUR BTL (IV SOLUTION) IMPLANT
PACK BASIN DAY SURGERY FS (CUSTOM PROCEDURE TRAY) ×2 IMPLANT
PENCIL SMOKE EVACUATOR (MISCELLANEOUS) ×2 IMPLANT
SHEET MEDIUM DRAPE 40X70 STRL (DRAPES) IMPLANT
SLEEVE SCD COMPRESS KNEE MED (MISCELLANEOUS) ×2 IMPLANT
SPONGE LAP 18X18 RF (DISPOSABLE) ×4 IMPLANT
STRIP CLOSURE SKIN 1/4X4 (GAUZE/BANDAGES/DRESSINGS) IMPLANT
SUT MNCRL AB 4-0 PS2 18 (SUTURE) ×2 IMPLANT
SUT VICRYL 3-0 CR8 SH (SUTURE) ×2 IMPLANT
SYR CONTROL 10ML LL (SYRINGE) ×2 IMPLANT
TOWEL GREEN STERILE FF (TOWEL DISPOSABLE) ×2 IMPLANT
TRAY FAXITRON CT DISP (TRAY / TRAY PROCEDURE) ×2 IMPLANT
TUBE CONNECTING 20X1/4 (TUBING) ×2 IMPLANT
YANKAUER SUCT BULB TIP NO VENT (SUCTIONS) ×2 IMPLANT

## 2020-05-25 NOTE — Anesthesia Procedure Notes (Signed)
Procedure Name: LMA Insertion Date/Time: 05/25/2020 9:58 AM Performed by: Genelle Bal, CRNA Pre-anesthesia Checklist: Patient identified, Emergency Drugs available, Suction available and Patient being monitored Patient Re-evaluated:Patient Re-evaluated prior to induction Oxygen Delivery Method: Circle system utilized Preoxygenation: Pre-oxygenation with 100% oxygen Induction Type: IV induction Ventilation: Mask ventilation without difficulty LMA: LMA inserted LMA Size: 4.0 Number of attempts: 1 Airway Equipment and Method: Bite block Placement Confirmation: positive ETCO2 Tube secured with: Tape Dental Injury: Teeth and Oropharynx as per pre-operative assessment

## 2020-05-25 NOTE — Op Note (Signed)
05/25/2020  11:25 AM  PATIENT:  Nancy Alvarado DOB: 1951-01-09 MRN: [id number]  PREOP DIAGNOSIS:   LEFT BREAST CANCER  POSTOP DIAGNOSIS:    Left breast cancer, 2 o'clock position (Tis, N0)  PROCEDURE:   Procedure(s):  LEFT BREAST LUMPECTOMY WITH RADIOACTIVE SEED LOCALIZATION X 2,   SURGEON:   Alphonsa Overall, M.D.  ANESTHESIA:   General  Anesthesiologist: Belinda Block, MD CRNA: Genelle Bal, CRNA  General  EBL:  <50  ml  DRAINS:  none   LOCAL MEDICATIONS USED:   30 cc 1/4% marcaine  SPECIMEN:   Left breast lumpectomy (6 color paint)  COUNTS CORRECT:  YES  INDICATIONS FOR PROCEDURE:  Nancy Alvarado is a 69 y.o. (DOB: 01/10/1951) AA female whose primary care physician is Vernie Shanks, MD and comes for left breast lumpectomy..   The options for breast cancer treatment have been discussed with the patient. She elected to proceed with lumpectomy and axillary sentinel lymph node.     The indications and potential complications of surgery were explained to the patient. Potential complications include, but are not limited to, bleeding, infection, the need for further surgery, and nerve injury.     She had two I131 seeds placed on 05/24/2020 in her left breast at [facility].  The seeds are in the 2 o'clock position of the left breast.     OPERATIVE NOTE:   The patient was taken to operating room # 8 at [facility] where she underwent a general anesthesia  supervised by Anesthesiologist: Belinda Block, MD CRNA: Genelle Bal, CRNA. Her left breast and axilla were prepped with  ChloraPrep and sterilely draped.    A time-out was held and the surgical check list was reviewed.    The cancer was about at the 2 o'clock position of the left breast.  She had two seeds, 3 cm from the areola and 8 cm from the areola that bracketed the calcifications of the left breast.  Because of the size of the area, I made an incision directly over the  seeds in the UOQ of the left breast.   I used the Neoprobe to identify the I131 seed.  I tried to excise an area around the tumor of at least 1 cm.    I excised this block of breast tissue approximately 6 cm by 8 cm  in diameter.   I painted the lumpectomy specimen with the 6 color paint kit and did a specimen mammogram which confirmed the mass, clips, and the seeds were all in the right position in the specimen.  The specimen was sent to pathology who called back to confirm that they have the seed and the specimen.   I then irrigated the wound with saline. I infiltrated approximately 30 mL of 1/4% Marcaine between the incisions. I placed 4 clips to mark biopsy cavity, at 12, 3, 6, and 9 o'clock.  I then closed the wound in layers using 3-0 Vicryl sutures for the deep layer. At the skin, I closed the incision with a 4-0 Monocryl suture. The incisions were then painted with Dermabond.  She had gauze placed over the wound and her breast placed in a breast binder.   The patient tolerated the procedure well, was transported to the recovery room in good condition. Sponge and needle count were correct at the end of the case.   Final pathology is pending.   Alphonsa Overall, MD, [facility] Pager: [phone] Office phone:  [phone]

## 2020-05-25 NOTE — Anesthesia Postprocedure Evaluation (Signed)
Anesthesia Post Note  Patient: Nancy Alvarado  Procedure(s) Performed: LEFT BREAST LUMPECTOMY WITH RADIOACTIVE SEED LOCALIZATION X 2 (Left Breast)     Patient location during evaluation: PACU Anesthesia Type: General Level of consciousness: awake Pain management: pain level controlled Vital Signs Assessment: post-procedure vital signs reviewed and stable Respiratory status: spontaneous breathing Cardiovascular status: stable Postop Assessment: no apparent nausea or vomiting Anesthetic complications: no   No complications documented.  Last Vitals:  Vitals:   05/25/20 1145 05/25/20 1215  BP: (!) 141/73 (P) 130/65  Pulse: 91 (P) 85  Resp: 19 (P) 16  Temp: 36.4 C (P) 36.5 C  SpO2: 92% (P) 95%    Last Pain:  Vitals:   05/25/20 1215  TempSrc:   PainSc: (P) 0-No pain                 Belenda Alviar

## 2020-05-25 NOTE — Anesthesia Preprocedure Evaluation (Signed)
Anesthesia Evaluation  Patient identified by MRN, date of birth, ID band Patient awake    Reviewed: Allergy & Precautions, NPO status , Patient's Chart, lab work & pertinent test results  Airway Mallampati: II  TM Distance: >3 FB     Dental   Pulmonary    breath sounds clear to auscultation       Cardiovascular negative cardio ROS   Rhythm:Regular Rate:Normal     Neuro/Psych    GI/Hepatic negative GI ROS, Neg liver ROS,   Endo/Other  diabetes  Renal/GU      Musculoskeletal   Abdominal   Peds  Hematology   Anesthesia Other Findings   Reproductive/Obstetrics                             Anesthesia Physical Anesthesia Plan  ASA: III  Anesthesia Plan: General   Post-op Pain Management:    Induction:   PONV Risk Score and Plan: 3 and Ondansetron, Dexamethasone and Midazolam  Airway Management Planned: LMA  Additional Equipment:   Intra-op Plan:   Post-operative Plan: Extubation in OR  Informed Consent: I have reviewed the patients History and Physical, chart, labs and discussed the procedure including the risks, benefits and alternatives for the proposed anesthesia with the patient or authorized representative who has indicated his/her understanding and acceptance.     Dental advisory given  Plan Discussed with: CRNA and Anesthesiologist  Anesthesia Plan Comments:         Anesthesia Quick Evaluation

## 2020-05-25 NOTE — Transfer of Care (Signed)
Immediate Anesthesia Transfer of Care Note  Patient: Nancy Alvarado  Procedure(s) Performed: LEFT BREAST LUMPECTOMY WITH RADIOACTIVE SEED LOCALIZATION X 2 (Left Breast)  Patient Location: PACU  Anesthesia Type:General  Level of Consciousness: awake, alert  and oriented  Airway & Oxygen Therapy: Patient Spontanous Breathing and Patient connected to face mask oxygen  Post-op Assessment: Report given to RN and Post -op Vital signs reviewed and stable  Post vital signs: Reviewed and stable  Last Vitals:  Vitals Value Taken Time  BP 129/67 05/25/20 1122  Temp    Pulse 80 05/25/20 1124  Resp 14 05/25/20 1124  SpO2 100 % 05/25/20 1124  Vitals shown include unvalidated device data.  Last Pain:  Vitals:   05/25/20 0741  TempSrc: Oral  PainSc: 0-No pain      Patients Stated Pain Goal: 3 (50/75/73 2256)  Complications: No complications documented.

## 2020-05-25 NOTE — Discharge Instructions (Signed)
[facility] - DISCHARGE INSTRUCTIONS TO PATIENT  Activity:  Driving - May drive in one to 3 days, if doing well and off pain meds   Lifting - No lifting more than 15 pounds for one week, then no limit                       Practice your Covid-19 protection:  Wear a mask, social distance, and wash your hands frequently  Wound Care:   Leave the breast binder on and the incision dry for 2 days, then you may shower.  You may wear a bra after 2 days, or continue to wear the breast binder.  Diet:  As tolerated  Follow up appointment:  Call Dr. Pollie Friar office [facility]) at [phone] for an appointment in 2 to 3 weeks..  Medications and dosages:  Resume your home medications.  You have a prescription for:  Ultram             You may also take Tylenol, ibuprofen, or Aleve for pain  Call Dr. Lucia Gaskins or his office  [phone]) if you have:  Temperature greater than 100.4,  Persistent nausea and vomiting,  Severe uncontrolled pain,  Redness, tenderness, or signs of infection (pain, swelling, redness, odor or green/yellow discharge around the site),  Difficulty breathing, headache or visual disturbances,  Any other questions or concerns you may have after discharge.  In an emergency, call 911 or go to an Emergency Department at a nearby hospital.

## 2020-05-25 NOTE — Interval H&P Note (Signed)
History and Physical Interval Note:  05/25/2020 9:31 AM  Nancy Alvarado  has presented today for surgery, with the diagnosis of LEFT BREAST CANCER.  The various methods of treatment have been discussed with the patient and family.   Seeds in place.  Her husband is here with her.  After consideration of risks, benefits and other options for treatment, the patient has consented to  Procedure(s): LEFT BREAST LUMPECTOMY WITH RADIOACTIVE SEED LOCALIZATION X 2 (Left) as a surgical intervention.  The patient's history has been reviewed, patient examined, no change in status, stable for surgery.  I have reviewed the patient's chart and labs.  Questions were answered to the patient's satisfaction.     Shann Medal

## 2020-05-29 ENCOUNTER — Encounter (HOSPITAL_BASED_OUTPATIENT_CLINIC_OR_DEPARTMENT_OTHER): Payer: Self-pay | Admitting: Surgery

## 2020-05-29 LAB — SURGICAL PATHOLOGY

## 2020-05-31 ENCOUNTER — Encounter: Payer: Self-pay | Admitting: *Deleted

## 2020-05-31 NOTE — Progress Notes (Signed)
Patient Care Team: Vernie Shanks, MD as PCP - General (Family Medicine) Mauro Kaufmann, RN as Oncology Nurse Navigator Rockwell Germany, RN as Oncology Nurse Navigator Alphonsa Overall, MD as Consulting Physician (General Surgery) Nicholas Lose, MD as Consulting Physician (Hematology and Oncology) Kyung Rudd, MD as Consulting Physician (Radiation Oncology)  DIAGNOSIS:    ICD-10-CM   1. Ductal carcinoma in situ (DCIS) of left breast  D05.12     SUMMARY OF ONCOLOGIC HISTORY: Oncology History  Ductal carcinoma in situ (DCIS) of left breast  03/08/2020 Initial Diagnosis   Screening mammogram detected Left breast calcs 7.4 cm UOQ, biopsy (anterior and posterior) revealed IG DCIS ER 100%, PR 0%   04/09/2020 Cancer Staging   Staging form: Breast, AJCC 8th Edition - Clinical stage from 04/09/2020: Stage 0 (cTis (DCIS), cN0, cM0, ER+, PR-, HER2: Not Assessed) - Signed by Nicholas Lose, MD on 04/09/2020   05/25/2020 Surgery   Left lumpectomy Lucia Gaskins): DCIS, 1.5cm, grade 2, clear margins.     CHIEF COMPLIANT: Follow-up s/p left lumpectomy   INTERVAL HISTORY: Nancy Alvarado is a 69 y.o. with above-mentioned history of left breast DCIS. She underwent a left lumpectomy with Dr. Lucia Gaskins on 05/25/20 for which pathology showed DCIS, 1.5cm, grade 2, clear margins. She presents to the clinic today to review the pathology report and discuss further treatment.   ALLERGIES:  has No Known Allergies.  MEDICATIONS:  Current Outpatient Medications  Medication Sig Dispense Refill  . atorvastatin (LIPITOR) 20 MG tablet Take 20 mg by mouth daily.    . ferrous sulfate 325 (65 FE) MG tablet Take 325 mg by mouth daily with breakfast.    . metFORMIN (GLUCOPHAGE) 500 MG tablet Take 1 tablet (500 mg total) by mouth daily with breakfast.    . traMADol (ULTRAM) 50 MG tablet Take 1 tablet (50 mg total) by mouth every 6 (six) hours as needed for moderate pain or severe pain. 12 tablet 0   No current  facility-administered medications for this visit.    PHYSICAL EXAMINATION: ECOG PERFORMANCE STATUS: 1 - Symptomatic but completely ambulatory  Vitals:   06/01/20 0853  BP: (!) 145/74  Pulse: 88  Resp: 18  Temp: (!) 96.2 F (35.7 C)  SpO2: 100%   Filed Weights   06/01/20 0853  Weight: 175 lb (79.4 kg)    LABORATORY DATA:  I have reviewed the data as listed CMP Latest Ref Rng & Units 05/22/2020  Glucose 70 - 99 mg/dL 123(H)  BUN 8 - 23 mg/dL 14  Creatinine 0.44 - 1.00 mg/dL 0.87  Sodium 135 - 145 mmol/L 140  Potassium 3.5 - 5.1 mmol/L 3.8  Chloride 98 - 111 mmol/L 105  CO2 22 - 32 mmol/L 26  Calcium 8.9 - 10.3 mg/dL 8.8(L)    Lab Results  Component Value Date   WBC 8.8 01/24/2015   HGB 11.9 (A) 01/24/2015   HCT 37.8 01/24/2015   MCV 77.4 (A) 01/24/2015    ASSESSMENT & PLAN:  Ductal carcinoma in situ (DCIS) of left breast 05/25/2020: Left lumpectomy Lucia Gaskins): DCIS, 1.5cm, grade 2, clear margins.  ER 100%, PR 0%  Pathology counseling: I discussed the final pathology report of the patient provided  a copy of this report. I discussed the margins. We also discussed the final staging along with previously performed ER/PR testing.  Treatment plan: 1.  Adjuvant radiation therapy 2. 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 Return to clinic after radiation to start antiestrogens     No orders of the defined types were placed in this encounter.  The patient has a good understanding of the overall plan. she agrees with it. she will call with any problems that may develop before the next visit here.  Total time spent: 30 mins including face to face time and time spent for planning, charting and coordination of care  Nicholas Lose, MD 06/01/2020  I, Cloyde Reams Dorshimer, am acting as scribe for Dr. Nicholas Lose.  I have reviewed the above documentation for  accuracy and completeness, and I agree with the above.

## 2020-06-01 ENCOUNTER — Other Ambulatory Visit: Payer: Self-pay

## 2020-06-01 ENCOUNTER — Inpatient Hospital Stay: Payer: Medicare Other | Attending: Hematology and Oncology | Admitting: Hematology and Oncology

## 2020-06-01 DIAGNOSIS — D0512 Intraductal carcinoma in situ of left breast: Secondary | ICD-10-CM | POA: Insufficient documentation

## 2020-06-01 DIAGNOSIS — Z79899 Other long term (current) drug therapy: Secondary | ICD-10-CM | POA: Diagnosis not present

## 2020-06-01 DIAGNOSIS — Z7984 Long term (current) use of oral hypoglycemic drugs: Secondary | ICD-10-CM | POA: Diagnosis not present

## 2020-06-01 NOTE — Assessment & Plan Note (Signed)
05/25/2020: Left lumpectomy Lucia Gaskins): DCIS, 1.5cm, grade 2, clear margins.  ER 100%, PR 0%  Pathology counseling: I discussed the final pathology report of the patient provided  a copy of this report. I discussed the margins. We also discussed the final staging along with previously performed ER/PR testing.  Treatment plan: 1.  Adjuvant radiation therapy 2. 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 Return to clinic after radiation to start antiestrogens

## 2020-06-04 ENCOUNTER — Telehealth: Payer: Self-pay | Admitting: Hematology and Oncology

## 2020-06-04 NOTE — Telephone Encounter (Signed)
No 9/10 los, no changes made to pt schedule

## 2020-06-13 ENCOUNTER — Ambulatory Visit
Admission: RE | Admit: 2020-06-13 | Discharge: 2020-06-13 | Disposition: A | Discharge status: Home / Self Care | Payer: Medicare Other | Source: Ambulatory Visit | Attending: Radiation Oncology | Admitting: Radiation Oncology

## 2020-06-13 ENCOUNTER — Other Ambulatory Visit: Payer: Self-pay

## 2020-06-13 ENCOUNTER — Encounter: Payer: Self-pay | Admitting: Radiation Oncology

## 2020-06-13 VITALS — BP 147/86 | HR 78 | Temp 98.2°F | Resp 18 | Ht 65.0 in | Wt 176.4 lb

## 2020-06-13 DIAGNOSIS — D0512 Intraductal carcinoma in situ of left breast: Secondary | ICD-10-CM | POA: Diagnosis present

## 2020-06-13 DIAGNOSIS — Z510 Encounter for antineoplastic radiation therapy: Secondary | ICD-10-CM | POA: Insufficient documentation

## 2020-06-13 NOTE — Patient Instructions (Signed)
Coronavirus (COVID-19) Are you at risk?  Are you at risk for the Coronavirus (COVID-19)?  To be considered HIGH RISK for Coronavirus (COVID-19), you have to meet the following criteria:  . Traveled to [place], [place], [place], [place] or [place]; or in the [place] to [place], [place], [place], or [place]; and have fever, cough, and shortness of breath within the last 2 weeks of travel OR . Been in close contact with a person diagnosed with COVID-19 within the last 2 weeks and have fever, cough, and shortness of breath . IF YOU DO NOT MEET THESE CRITERIA, YOU ARE CONSIDERED LOW RISK FOR COVID-19.  What to do if you are HIGH RISK for COVID-19?  . If you are having a medical emergency, call 911. . Seek medical care right away. Before you go to a doctor's office, urgent care or emergency department, call ahead and tell them about your recent travel, contact with someone diagnosed with COVID-19, and your symptoms. You should receive instructions from your physician's office regarding next steps of care.  . When you arrive at healthcare provider, tell the healthcare staff immediately you have returned from visiting [place], [place], [place], [place] or [place]; or traveled in the [place] to [place], [place], [place], or [place]; in the last two weeks or you have been in close contact with a person diagnosed with COVID-19 in the last 2 weeks.   . Tell the health care staff about your symptoms: fever, cough and shortness of breath. . After you have been seen by a medical provider, you will be either: o Tested for (COVID-19) and discharged home on quarantine except to seek medical care if symptoms worsen, and asked to  - Stay home and avoid contact with others until you get your results (4-5 days)  - Avoid travel on public transportation if possible (such as bus, train, or airplane) or o Sent to the Emergency Department by EMS for evaluation, COVID-19 testing, and possible  admission depending on your condition and test results.  What to do if you are LOW RISK for COVID-19?  Reduce your risk of any infection by using the same precautions used for avoiding the common cold or flu:  . Wash your hands often with soap and warm water for at least 20 seconds.  If soap and water are not readily available, use an alcohol-based hand sanitizer with at least 60% alcohol.  . If coughing or sneezing, cover your mouth and nose by coughing or sneezing into the elbow areas of your shirt or coat, into a tissue or into your sleeve (not your hands). . Avoid shaking hands with others and consider head nods or verbal greetings only. . Avoid touching your eyes, nose, or mouth with unwashed hands.  . Avoid close contact with people who are sick. . Avoid places or events with large numbers of people in one location, like concerts or sporting events. . Carefully consider travel plans you have or are making. . If you are planning any travel outside or inside the [place], visit the CDC's Travelers' Health webpage for the latest health notices. . If you have some symptoms but not all symptoms, continue to monitor at home and seek medical attention if your symptoms worsen. . If you are having a medical emergency, call 911.   ADDITIONAL HEALTHCARE OPTIONS FOR PATIENTS  [facility] / e-Visit: [email or web address]         [facility]: [phone]  [place]   Urgent Care: [facility]: [phone] .cov

## 2020-06-13 NOTE — Progress Notes (Signed)
Patient in for consult today for radiation to left breast for DCIS post lumpectomy on 05/25/2020. She denies any pain. No history of radiation. No pacemaker no methotrexate. Questions and concerns addressed.

## 2020-06-13 NOTE — Progress Notes (Signed)
Radiation Oncology         ([phone]) [phone] ________________________________  Name: Nancy Alvarado        MRN: [id number]  Date of Service: 06/13/2020 DOB: [birth year]  GT:XMIW, Edwyna Shell, MD  Nicholas Lose, MD     REFERRING PHYSICIAN: Nicholas Lose, MD   DIAGNOSIS: The encounter diagnosis was Ductal carcinoma in situ (DCIS) of left breast.   HISTORY OF PRESENT ILLNESS: Nancy Alvarado is a 69 y.o. female seen for a new diagnosis of left breast cancer. The patient was noted to have a screening detected abnormality in the left breast. This was followed by diagnostic imaging, and mammographically revealed a grouping of calcifications in the left breast measuring 7.4 cm in greatest dimension in the upper outer quadrant.  No mass was otherwise identified and it was recommended that she undergo a stereotactic biopsy which was performed on 03/08/2020 final pathology revealed calcifications both in the anterior and posterior specimens consistent with intermediate grade DCIS.  Her tumor was ER positive/PR negative.  She proceeded with lumpectomy on 05/25/2020 which revealed an intermediate grade DCIS measuring 1.5 cm with negative margins.  She met with Dr. Lindi Adie who has plans to offer adjuvant antiestrogen therapy.  She is seen today to discuss the options of adjuvant radiotherapy.   PREVIOUS RADIATION THERAPY: No   PAST MEDICAL HISTORY:  Past Medical History:  Diagnosis Date  . Breast cancer ([facility]) 02/2020   Left Breast  . Diabetes mellitus without complication ([facility])   . High cholesterol        PAST SURGICAL HISTORY: Past Surgical History:  Procedure Laterality Date  . BREAST LUMPECTOMY WITH RADIOACTIVE SEED LOCALIZATION Left 05/25/2020   Procedure: LEFT BREAST LUMPECTOMY WITH RADIOACTIVE SEED LOCALIZATION X 2;  Surgeon: Alphonsa Overall, MD;  Location: [facility];  Service: General;  Laterality: Left;  . OVARIAN CYST REMOVAL       FAMILY HISTORY:  Family  History  Problem Relation Age of Onset  . Cancer Mother   . Diabetes Mother   . Heart disease Mother   . Diabetes Father   . Heart disease Father   . Hypertension Sister   . Diabetes Brother   . Hypertension Brother   . Stroke Brother      SOCIAL HISTORY:  reports that she has never smoked. She has never used smokeless tobacco. She reports that she does not drink alcohol and does not use drugs. 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   ALLERGIES: Patient has no known allergies.   MEDICATIONS:  Current Outpatient Medications  Medication Sig Dispense Refill  . atorvastatin (LIPITOR) 20 MG tablet Take 20 mg by mouth daily.    . ferrous sulfate 325 (65 FE) MG tablet Take 325 mg by mouth daily with breakfast.    . metFORMIN (GLUCOPHAGE) 500 MG tablet Take 1 tablet (500 mg total) by mouth daily with breakfast.     No current facility-administered medications for this encounter.     REVIEW OF SYSTEMS: On review of systems, the patient reports that she is doing well overall.  She feels as though her breast is healed well, she denies any concerns with redness or separation of the incision line.  She states that she is ready to proceed with radiation, she is traveling out of state for a family wedding next weekend.  No other complaints are verbalized.    PHYSICAL EXAM:  Wt Readings from Last 3 Encounters:  06/01/20  175 lb (79.4 kg)  05/25/20 173 lb 8 oz (78.7 kg)  04/12/20 172 lb (78 kg)   Temp Readings from Last 3 Encounters:  06/01/20 (!) 96.2 F (35.7 C) (Tympanic)  05/25/20 (P) 97.7 F (36.5 C)  04/09/20 98.5 F (36.9 C) (Temporal)   BP Readings from Last 3 Encounters:  06/01/20 (!) 145/74  05/25/20 (P) 130/65  04/09/20 (!) 142/64   Pulse Readings from Last 3 Encounters:  06/01/20 88  05/25/20 (P) 85  04/09/20 71   In general this is a well appearing African American female in no acute distress. She's alert and oriented x4 and  appropriate throughout the examination. Cardiopulmonary assessment is negative for acute distress and she exhibits normal effort.  Her left breast is assessed and reveals a well-healed lateral incision, no evidence of erythema is identified.   ECOG = 0  0 - Asymptomatic (Fully active, able to carry on all predisease activities without restriction)  1 - Symptomatic but completely ambulatory (Restricted in physically strenuous activity but ambulatory and able to carry out work of a light or sedentary nature. For example, light housework, office work)  2 - Symptomatic, <50% in bed during the day (Ambulatory and capable of all self care but unable to carry out any work activities. Up and about more than 50% of waking hours)  3 - Symptomatic, >50% in bed, but not bedbound (Capable of only limited self-care, confined to bed or chair 50% or more of waking hours)  4 - Bedbound (Completely disabled. Cannot carry on any self-care. Totally confined to bed or chair)  5 - Death   Eustace Pen MM, Creech RH, Tormey DC, et al. [phone]). "Toxicity and response criteria of the [facility]". [facility] [place] Oncol. 5 (6): 649-55    LABORATORY DATA:  Lab Results  Component Value Date   WBC 8.8 01/24/2015   HGB 11.9 (A) 01/24/2015   HCT 37.8 01/24/2015   MCV 77.4 (A) 01/24/2015   Lab Results  Component Value Date   NA 140 05/22/2020   K 3.8 05/22/2020   CL 105 05/22/2020   CO2 26 05/22/2020   No results found for: ALT, AST, GGT, ALKPHOS, BILITOT    RADIOGRAPHY: MM Breast Surgical Specimen  Result Date: 05/25/2020 CLINICAL DATA:  Specimen radiograph status post left breast lumpectomy. EXAM: SPECIMEN RADIOGRAPH OF THE LEFT BREAST COMPARISON:  Previous exam(s). FINDINGS: Status post excision of the left breast. The radioactive seeds and biopsy marker clips are present and completely intact. These findings were communicated to the OR at 10:45 a.m. IMPRESSION: Specimen radiograph of the left  breast. Electronically Signed   By: Ammie Ferrier M.D.   On: 05/25/2020 10:46   MM LT RADIOACTIVE SEED LOC MAMMO GUIDE  Result Date: 05/24/2020 CLINICAL DATA:  69 year old with biopsy DCIS spanning approximately 6 cm in UPPER OUTER QUADRANT of the LEFT breast. Bracketed radioactive seed localization is performed in anticipation of lumpectomy which is scheduled for tomorrow. EXAM: MAMMOGRAPHIC GUIDED BRACKETED RADIOACTIVE SEED LOCALIZATION OF THE LEFT BREAST x 2 COMPARISON:  Previous exam(s). FINDINGS: Patient presents for radioactive seed localization prior to LEFT breast lumpectomy. I met with the patient and we discussed the procedure of seed localization including benefits and alternatives. We discussed the high likelihood of a successful procedure. We discussed the risks of the procedure including infection, bleeding, tissue injury and further surgery. We discussed the low dose of radioactivity involved in the procedure. Informed, written consent was given. The usual time-out protocol was  performed immediately prior to the procedure. Using mammographic guidance, sterile technique with chlorhexidine as skin antisepsis, 1% lidocaine as local anesthetic, an I-125 radioactive seed was used to localize the coil shaped clip at the posterior margin of biopsy-proven DCIS and the X shaped clip at the anterior margin of the biopsy-proven DCIS using a superior approach. The follow-up mammogram images confirm that both seeds are appropriately positioned immediately adjacent to the clips, and the seeds are approximately 6 cm apart. The images were marked for Dr. Lucia Gaskins. Follow-up survey of the patient confirms the presence of the radioactive seeds. # 1) ANTERIOR margin, X clip: Order number of I-125 seed: [id number] Total activity: 0.247 mCi Reference Date: 05/09/2020 # 2) POSTERIOR margin, coil clip: Order number of I-125 seed: [id number] Total activity: 0.249 mCi Reference date: 05/22/2020 The patient tolerated the  procedure well and was released from the [facility]. She was given instructions regarding seed removal. IMPRESSION: Bracketed radioactive seed localization (x 2) of biopsy-proven DCIS involving UPPER OUTER QUADRANT of the LEFT breast. No apparent complications. Electronically Signed   By: Evangeline Dakin M.D.   On: 05/24/2020 13:52   MM LT RAD SEED EA ADD LESION LOC MAMMO  Result Date: 05/24/2020 CLINICAL DATA:  69 year old with biopsy DCIS spanning approximately 6 cm in UPPER OUTER QUADRANT of the LEFT breast. Bracketed radioactive seed localization is performed in anticipation of lumpectomy which is scheduled for tomorrow. EXAM: MAMMOGRAPHIC GUIDED BRACKETED RADIOACTIVE SEED LOCALIZATION OF THE LEFT BREAST x 2 COMPARISON:  Previous exam(s). FINDINGS: Patient presents for radioactive seed localization prior to LEFT breast lumpectomy. I met with the patient and we discussed the procedure of seed localization including benefits and alternatives. We discussed the high likelihood of a successful procedure. We discussed the risks of the procedure including infection, bleeding, tissue injury and further surgery. We discussed the low dose of radioactivity involved in the procedure. Informed, written consent was given. The usual time-out protocol was performed immediately prior to the procedure. Using mammographic guidance, sterile technique with chlorhexidine as skin antisepsis, 1% lidocaine as local anesthetic, an I-125 radioactive seed was used to localize the coil shaped clip at the posterior margin of biopsy-proven DCIS and the X shaped clip at the anterior margin of the biopsy-proven DCIS using a superior approach. The follow-up mammogram images confirm that both seeds are appropriately positioned immediately adjacent to the clips, and the seeds are approximately 6 cm apart. The images were marked for Dr. Lucia Gaskins. Follow-up survey of the patient confirms the presence of the radioactive seeds. # 1) ANTERIOR  margin, X clip: Order number of I-125 seed: [id number] Total activity: 0.247 mCi Reference Date: 05/09/2020 # 2) POSTERIOR margin, coil clip: Order number of I-125 seed: [id number] Total activity: 0.249 mCi Reference date: 05/22/2020 The patient tolerated the procedure well and was released from the [facility]. She was given instructions regarding seed removal. IMPRESSION: Bracketed radioactive seed localization (x 2) of biopsy-proven DCIS involving UPPER OUTER QUADRANT of the LEFT breast. No apparent complications. Electronically Signed   By: Evangeline Dakin M.D.   On: 05/24/2020 13:52       IMPRESSION/PLAN: 1. Intermediate grade, ER/PR positive DCIS of the left breast. Dr. Lisbeth Renshaw discusses the final pathology findings and reviews the nature of noninvasive breast disease. She has done well surgically and is ready to proceed with adjuvant radiotherapy to reduce the risks of local recurrence. We discussed the risks, benefits, short, and long term effects of radiotherapy, and the patient is interested in proceeding if  she is able to continue with breast conservation surgery. Dr. Lisbeth Renshaw discusses the delivery and logistics of radiotherapy and recommends 4 weeks of radiotherapy. Written consent is obtained and placed in the chart, a copy was provided to the patient. She will simulate today, and we will plan to begin treatment when she returns from her trip next weekend, ideal time to start would be the first full week of October.  She states agreement with this plan.  In a visit lasting 45 minutes, greater than 50% of the time was spent face to face discussing the patient's condition, in preparation for the discussion, and coordinating the patient's care.   The above documentation reflects my direct findings during this shared patient visit. Please see the separate note by Dr. Lisbeth Renshaw on this date for the remainder of the patient's plan of care.    Carola Rhine, PAC

## 2020-06-19 ENCOUNTER — Encounter: Payer: Self-pay | Admitting: *Deleted

## 2020-06-19 ENCOUNTER — Other Ambulatory Visit: Payer: Self-pay

## 2020-06-20 ENCOUNTER — Telehealth: Payer: Self-pay | Admitting: Hematology and Oncology

## 2020-06-20 NOTE — Telephone Encounter (Signed)
Scheduled appt per 9/28 sch msg- pt mailed reminder letter.

## 2020-06-25 DIAGNOSIS — D0512 Intraductal carcinoma in situ of left breast: Secondary | ICD-10-CM | POA: Diagnosis present

## 2020-06-26 ENCOUNTER — Ambulatory Visit
Admission: RE | Admit: 2020-06-26 | Discharge: 2020-06-26 | Disposition: A | Discharge status: Home / Self Care | Payer: Medicare Other | Source: Ambulatory Visit | Attending: Radiation Oncology | Admitting: Radiation Oncology

## 2020-06-26 ENCOUNTER — Other Ambulatory Visit: Payer: Self-pay

## 2020-06-26 DIAGNOSIS — D0512 Intraductal carcinoma in situ of left breast: Secondary | ICD-10-CM | POA: Diagnosis not present

## 2020-06-27 ENCOUNTER — Other Ambulatory Visit: Payer: Self-pay

## 2020-06-27 ENCOUNTER — Ambulatory Visit
Admission: RE | Admit: 2020-06-27 | Discharge: 2020-06-27 | Disposition: A | Discharge status: Home / Self Care | Payer: Medicare Other | Source: Ambulatory Visit | Attending: Radiation Oncology | Admitting: Radiation Oncology

## 2020-06-27 DIAGNOSIS — D0512 Intraductal carcinoma in situ of left breast: Secondary | ICD-10-CM | POA: Diagnosis not present

## 2020-06-28 ENCOUNTER — Ambulatory Visit
Admission: RE | Admit: 2020-06-28 | Discharge: 2020-06-28 | Disposition: A | Discharge status: Home / Self Care | Payer: Medicare Other | Source: Ambulatory Visit | Attending: Radiation Oncology | Admitting: Radiation Oncology

## 2020-06-28 ENCOUNTER — Other Ambulatory Visit: Payer: Self-pay

## 2020-06-28 DIAGNOSIS — D0512 Intraductal carcinoma in situ of left breast: Secondary | ICD-10-CM | POA: Diagnosis not present

## 2020-06-28 NOTE — Progress Notes (Signed)
Pt here for patient teaching.  Pt given Radiation and You booklet, skin care instructions, Alra deodorant and Radiaplex gel.  Reviewed areas of pertinence such as fatigue, hair loss, skin changes, breast tenderness and breast swelling . Pt able to give teach back of to pat skin and use unscented/gentle soap,apply Radiaplex bid, avoid applying anything to skin within 4 hours of treatment, avoid wearing an under wire bra and to use an electric razor if they must shave. Pt verbalizes understanding of information given and will contact nursing with any questions or concerns.     [name] M. Bonnie Roig RN, BSN      

## 2020-06-29 ENCOUNTER — Ambulatory Visit
Admission: RE | Admit: 2020-06-29 | Discharge: 2020-06-29 | Disposition: A | Discharge status: Home / Self Care | Payer: Medicare Other | Source: Ambulatory Visit | Attending: Radiation Oncology | Admitting: Radiation Oncology

## 2020-06-29 DIAGNOSIS — D0512 Intraductal carcinoma in situ of left breast: Secondary | ICD-10-CM

## 2020-06-29 MED ORDER — RADIAPLEXRX EX GEL: Freq: Once | CUTANEOUS | Status: AC

## 2020-06-29 MED ORDER — ALRA NON-METALLIC DEODORANT (RAD-ONC)
1.0000 "application " | Freq: Once | TOPICAL | Status: AC
  Administered 2020-06-29: 1 via TOPICAL

## 2020-07-02 ENCOUNTER — Ambulatory Visit
Admission: RE | Admit: 2020-07-02 | Discharge: 2020-07-02 | Disposition: A | Discharge status: Home / Self Care | Payer: Medicare Other | Source: Ambulatory Visit | Attending: Radiation Oncology | Admitting: Radiation Oncology

## 2020-07-02 DIAGNOSIS — D0512 Intraductal carcinoma in situ of left breast: Secondary | ICD-10-CM | POA: Diagnosis not present

## 2020-07-03 ENCOUNTER — Other Ambulatory Visit: Payer: Self-pay

## 2020-07-03 ENCOUNTER — Ambulatory Visit
Admission: RE | Admit: 2020-07-03 | Discharge: 2020-07-03 | Disposition: A | Discharge status: Home / Self Care | Payer: Medicare Other | Source: Ambulatory Visit | Attending: Radiation Oncology | Admitting: Radiation Oncology

## 2020-07-03 DIAGNOSIS — D0512 Intraductal carcinoma in situ of left breast: Secondary | ICD-10-CM | POA: Diagnosis not present

## 2020-07-04 ENCOUNTER — Ambulatory Visit
Admission: RE | Admit: 2020-07-04 | Discharge: 2020-07-04 | Disposition: A | Discharge status: Home / Self Care | Payer: Medicare Other | Source: Ambulatory Visit | Attending: Radiation Oncology | Admitting: Radiation Oncology

## 2020-07-04 ENCOUNTER — Other Ambulatory Visit: Payer: Self-pay

## 2020-07-04 DIAGNOSIS — D0512 Intraductal carcinoma in situ of left breast: Secondary | ICD-10-CM | POA: Diagnosis not present

## 2020-07-05 ENCOUNTER — Ambulatory Visit
Admission: RE | Admit: 2020-07-05 | Discharge: 2020-07-05 | Disposition: A | Discharge status: Home / Self Care | Payer: Medicare Other | Source: Ambulatory Visit | Attending: Radiation Oncology | Admitting: Radiation Oncology

## 2020-07-05 ENCOUNTER — Other Ambulatory Visit: Payer: Self-pay

## 2020-07-05 DIAGNOSIS — D0512 Intraductal carcinoma in situ of left breast: Secondary | ICD-10-CM | POA: Diagnosis not present

## 2020-07-06 ENCOUNTER — Other Ambulatory Visit: Payer: Self-pay

## 2020-07-06 ENCOUNTER — Ambulatory Visit
Admission: RE | Admit: 2020-07-06 | Discharge: 2020-07-06 | Disposition: A | Discharge status: Home / Self Care | Payer: Medicare Other | Source: Ambulatory Visit | Attending: Radiation Oncology | Admitting: Radiation Oncology

## 2020-07-06 DIAGNOSIS — D0512 Intraductal carcinoma in situ of left breast: Secondary | ICD-10-CM | POA: Diagnosis not present

## 2020-07-09 ENCOUNTER — Ambulatory Visit
Admission: RE | Admit: 2020-07-09 | Discharge: 2020-07-09 | Disposition: A | Discharge status: Home / Self Care | Payer: Medicare Other | Source: Ambulatory Visit | Attending: Radiation Oncology | Admitting: Radiation Oncology

## 2020-07-09 ENCOUNTER — Other Ambulatory Visit: Payer: Self-pay

## 2020-07-09 DIAGNOSIS — D0512 Intraductal carcinoma in situ of left breast: Secondary | ICD-10-CM | POA: Diagnosis not present

## 2020-07-10 ENCOUNTER — Ambulatory Visit
Admission: RE | Admit: 2020-07-10 | Discharge: 2020-07-10 | Disposition: A | Discharge status: Home / Self Care | Payer: Medicare Other | Source: Ambulatory Visit | Attending: Radiation Oncology | Admitting: Radiation Oncology

## 2020-07-10 DIAGNOSIS — D0512 Intraductal carcinoma in situ of left breast: Secondary | ICD-10-CM | POA: Diagnosis not present

## 2020-07-11 ENCOUNTER — Ambulatory Visit
Admission: RE | Admit: 2020-07-11 | Discharge: 2020-07-11 | Disposition: A | Discharge status: Home / Self Care | Payer: Medicare Other | Source: Ambulatory Visit | Attending: Radiation Oncology | Admitting: Radiation Oncology

## 2020-07-11 ENCOUNTER — Other Ambulatory Visit: Payer: Self-pay

## 2020-07-11 DIAGNOSIS — D0512 Intraductal carcinoma in situ of left breast: Secondary | ICD-10-CM | POA: Diagnosis not present

## 2020-07-12 ENCOUNTER — Ambulatory Visit
Admission: RE | Admit: 2020-07-12 | Discharge: 2020-07-12 | Disposition: A | Discharge status: Home / Self Care | Payer: Medicare Other | Source: Ambulatory Visit | Attending: Radiation Oncology | Admitting: Radiation Oncology

## 2020-07-12 ENCOUNTER — Other Ambulatory Visit: Payer: Self-pay

## 2020-07-12 DIAGNOSIS — D0512 Intraductal carcinoma in situ of left breast: Secondary | ICD-10-CM | POA: Diagnosis not present

## 2020-07-13 ENCOUNTER — Ambulatory Visit
Admission: RE | Admit: 2020-07-13 | Discharge: 2020-07-13 | Disposition: A | Discharge status: Home / Self Care | Payer: Medicare Other | Source: Ambulatory Visit | Attending: Radiation Oncology | Admitting: Radiation Oncology

## 2020-07-13 ENCOUNTER — Ambulatory Visit: Payer: Medicare Other | Admitting: Radiation Oncology

## 2020-07-13 ENCOUNTER — Other Ambulatory Visit: Payer: Self-pay

## 2020-07-13 DIAGNOSIS — D0512 Intraductal carcinoma in situ of left breast: Secondary | ICD-10-CM | POA: Diagnosis not present

## 2020-07-15 NOTE — Progress Notes (Signed)
Patient Care Team: Vernie Shanks, MD as PCP - General (Family Medicine) Mauro Kaufmann, RN as Oncology Nurse Navigator Rockwell Germany, RN as Oncology Nurse Navigator Alphonsa Overall, MD as Consulting Physician (General Surgery) Nicholas Lose, MD as Consulting Physician (Hematology and Oncology) Kyung Rudd, MD as Consulting Physician (Radiation Oncology)  DIAGNOSIS:    ICD-10-CM   1. Ductal carcinoma in situ (DCIS) of left breast  D05.12     SUMMARY OF ONCOLOGIC HISTORY: Oncology History  Ductal carcinoma in situ (DCIS) of left breast  03/08/2020 Initial Diagnosis   Screening mammogram detected Left breast calcs 7.4 cm UOQ, biopsy (anterior and posterior) revealed IG DCIS ER 100%, PR 0%   04/09/2020 Cancer Staging   Staging form: Breast, AJCC 8th Edition - Clinical stage from 04/09/2020: Stage 0 (cTis (DCIS), cN0, cM0, ER+, PR-, HER2: Not Assessed) - Signed by Nicholas Lose, MD on 04/09/2020   05/25/2020 Surgery   Left lumpectomy Lucia Gaskins): DCIS, 1.5cm, grade 2, clear margins.   06/27/2020 -  Radiation Therapy   Adjuvant radiation     CHIEF COMPLIANT: Follow-up to discuss antiestrogen therapy   INTERVAL HISTORY: Nancy Alvarado is a 69 y.o. with above-mentioned history of left breast DCIS who underwent a left lumpectomy and is currently on radiation treatment. She presents to the clinic today to discuss antiestrogen therapy. She is tolerating radiation extremely well.  ALLERGIES:  has No Known Allergies.  MEDICATIONS:  Current Outpatient Medications  Medication Sig Dispense Refill  . atorvastatin (LIPITOR) 20 MG tablet Take 20 mg by mouth daily.    . ferrous sulfate 325 (65 FE) MG tablet Take 325 mg by mouth daily with breakfast.    . metFORMIN (GLUCOPHAGE) 500 MG tablet Take 1 tablet (500 mg total) by mouth daily with breakfast.    . ONETOUCH ULTRA test strip every morning.     No current facility-administered medications for this visit.    PHYSICAL  EXAMINATION: ECOG PERFORMANCE STATUS: 1 - Symptomatic but completely ambulatory  Vitals:   07/16/20 1433  BP: (!) 161/89  Pulse: 87  Resp: 18  Temp: (!) 97.3 F (36.3 C)  SpO2: 99%   Filed Weights   07/16/20 1433  Weight: 174 lb (78.9 kg)    BREAST: No palpable masses or nodules in either right or left breasts. No palpable axillary supraclavicular or infraclavicular adenopathy no breast tenderness or nipple discharge. (exam performed in the presence of a chaperone)  LABORATORY DATA:  I have reviewed the data as listed CMP Latest Ref Rng & Units 05/22/2020  Glucose 70 - 99 mg/dL 123(H)  BUN 8 - 23 mg/dL 14  Creatinine 0.44 - 1.00 mg/dL 0.87  Sodium 135 - 145 mmol/L 140  Potassium 3.5 - 5.1 mmol/L 3.8  Chloride 98 - 111 mmol/L 105  CO2 22 - 32 mmol/L 26  Calcium 8.9 - 10.3 mg/dL 8.8(L)    Lab Results  Component Value Date   WBC 8.8 01/24/2015   HGB 11.9 (A) 01/24/2015   HCT 37.8 01/24/2015   MCV 77.4 (A) 01/24/2015    ASSESSMENT & PLAN:  Ductal carcinoma in situ (DCIS) of left breast 05/25/2020: Left lumpectomy Lucia Gaskins): DCIS, 1.5cm, grade 2, clear margins.  ER 100%, PR 0%  Treatment plan: 1.  Adjuvant radiation therapy 06/27/2020-07/24/2020 2. 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  Anastrozole counseling:We discussed the risks and benefits of anti-estrogen therapy  with aromatase inhibitors. These include but not limited to insomnia, hot flashes, mood changes, vaginal dryness, bone density loss, and weight gain. We strongly believe that the benefits far outweigh the risks. Patient understands these risks and consented to starting treatment. Planned treatment duration is 5 years.   Return to clinic in 3 months for survivorship care plan visit   No orders of the defined types were placed in this encounter.  The patient has a good understanding of the  overall plan. she agrees with it. she will call with any problems that may develop before the next visit here.  Total time spent: 30 mins including face to face time and time spent for planning, charting and coordination of care  Nicholas Lose, MD 07/16/2020  I, Cloyde Reams Dorshimer, am acting as scribe for Dr. Nicholas Lose.  I have reviewed the above documentation for accuracy and completeness, and I agree with the above.

## 2020-07-16 ENCOUNTER — Other Ambulatory Visit: Payer: Self-pay

## 2020-07-16 ENCOUNTER — Inpatient Hospital Stay: Payer: Medicare Other | Attending: Hematology and Oncology | Admitting: Hematology and Oncology

## 2020-07-16 ENCOUNTER — Ambulatory Visit
Admission: RE | Admit: 2020-07-16 | Discharge: 2020-07-16 | Disposition: A | Discharge status: Home / Self Care | Payer: Medicare Other | Source: Ambulatory Visit | Attending: Radiation Oncology | Admitting: Radiation Oncology

## 2020-07-16 DIAGNOSIS — D0512 Intraductal carcinoma in situ of left breast: Secondary | ICD-10-CM

## 2020-07-16 MED ORDER — ANASTROZOLE 1 MG PO TABS: 1.0000 mg | ORAL_TABLET | Freq: Every day | ORAL | 3 refills | Status: DC

## 2020-07-16 NOTE — Assessment & Plan Note (Signed)
05/25/2020: Left lumpectomy Nancy Alvarado): DCIS, 1.5cm, grade 2, clear margins.  ER 100%, PR 0%  Pathology counseling: I discussed the final pathology report of the patient provided  a copy of this report. I discussed the margins. We also discussed the final staging along with previously performed ER/PR testing.  Treatment plan: 1.  Adjuvant radiation therapy 06/27/2020-07/24/2020 2. 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  Tamoxifen counseling:We discussed the risks and benefits of tamoxifen. These include but not limited to insomnia, hot flashes, mood changes, vaginal dryness, and weight gain. Although rare, serious side effects including endometrial cancer, risk of blood clots were also discussed. We strongly believe that the benefits far outweigh the risks. Patient understands these risks and consented to starting treatment. Planned treatment duration is 5 years.  Return to clinic in 3 months for survivorship care plan visit

## 2020-07-17 ENCOUNTER — Ambulatory Visit
Admission: RE | Admit: 2020-07-17 | Discharge: 2020-07-17 | Disposition: A | Discharge status: Home / Self Care | Payer: Medicare Other | Source: Ambulatory Visit | Attending: Radiation Oncology | Admitting: Radiation Oncology

## 2020-07-17 DIAGNOSIS — D0512 Intraductal carcinoma in situ of left breast: Secondary | ICD-10-CM | POA: Diagnosis not present

## 2020-07-18 ENCOUNTER — Ambulatory Visit
Admission: RE | Admit: 2020-07-18 | Discharge: 2020-07-18 | Disposition: A | Discharge status: Home / Self Care | Payer: Medicare Other | Source: Ambulatory Visit | Attending: Radiation Oncology | Admitting: Radiation Oncology

## 2020-07-18 ENCOUNTER — Other Ambulatory Visit: Payer: Self-pay

## 2020-07-18 DIAGNOSIS — D0512 Intraductal carcinoma in situ of left breast: Secondary | ICD-10-CM | POA: Diagnosis not present

## 2020-07-19 ENCOUNTER — Ambulatory Visit
Admission: RE | Admit: 2020-07-19 | Discharge: 2020-07-19 | Disposition: A | Discharge status: Home / Self Care | Payer: Medicare Other | Source: Ambulatory Visit | Attending: Radiation Oncology | Admitting: Radiation Oncology

## 2020-07-19 ENCOUNTER — Other Ambulatory Visit: Payer: Self-pay

## 2020-07-19 DIAGNOSIS — D0512 Intraductal carcinoma in situ of left breast: Secondary | ICD-10-CM | POA: Diagnosis not present

## 2020-07-20 ENCOUNTER — Other Ambulatory Visit: Payer: Self-pay

## 2020-07-20 ENCOUNTER — Ambulatory Visit
Admission: RE | Admit: 2020-07-20 | Discharge: 2020-07-20 | Disposition: A | Discharge status: Home / Self Care | Payer: Medicare Other | Source: Ambulatory Visit | Attending: Radiation Oncology | Admitting: Radiation Oncology

## 2020-07-20 DIAGNOSIS — D0512 Intraductal carcinoma in situ of left breast: Secondary | ICD-10-CM | POA: Diagnosis not present

## 2020-07-23 ENCOUNTER — Ambulatory Visit: Payer: Medicare Other

## 2020-07-23 ENCOUNTER — Encounter: Payer: Self-pay | Admitting: *Deleted

## 2020-07-24 ENCOUNTER — Encounter: Payer: Self-pay | Admitting: Radiation Oncology

## 2020-07-24 ENCOUNTER — Ambulatory Visit
Admission: RE | Admit: 2020-07-24 | Discharge: 2020-07-24 | Disposition: A | Discharge status: Home / Self Care | Payer: Medicare Other | Source: Ambulatory Visit | Attending: Radiation Oncology | Admitting: Radiation Oncology

## 2020-07-24 ENCOUNTER — Other Ambulatory Visit: Payer: Self-pay

## 2020-07-24 DIAGNOSIS — D0512 Intraductal carcinoma in situ of left breast: Secondary | ICD-10-CM | POA: Insufficient documentation

## 2020-07-24 IMAGING — MG MM BREAST LOCALIZATION CLIP
4 series · 4 of 12 positions shown · non-contrast
Comparison: Previous exam(s).

CLINICAL DATA: Evaluate biopsy markers

EXAM:
DIAGNOSTIC LEFT MAMMOGRAM POST STEREOTACTIC BIOPSY

[L ML synth-2D]
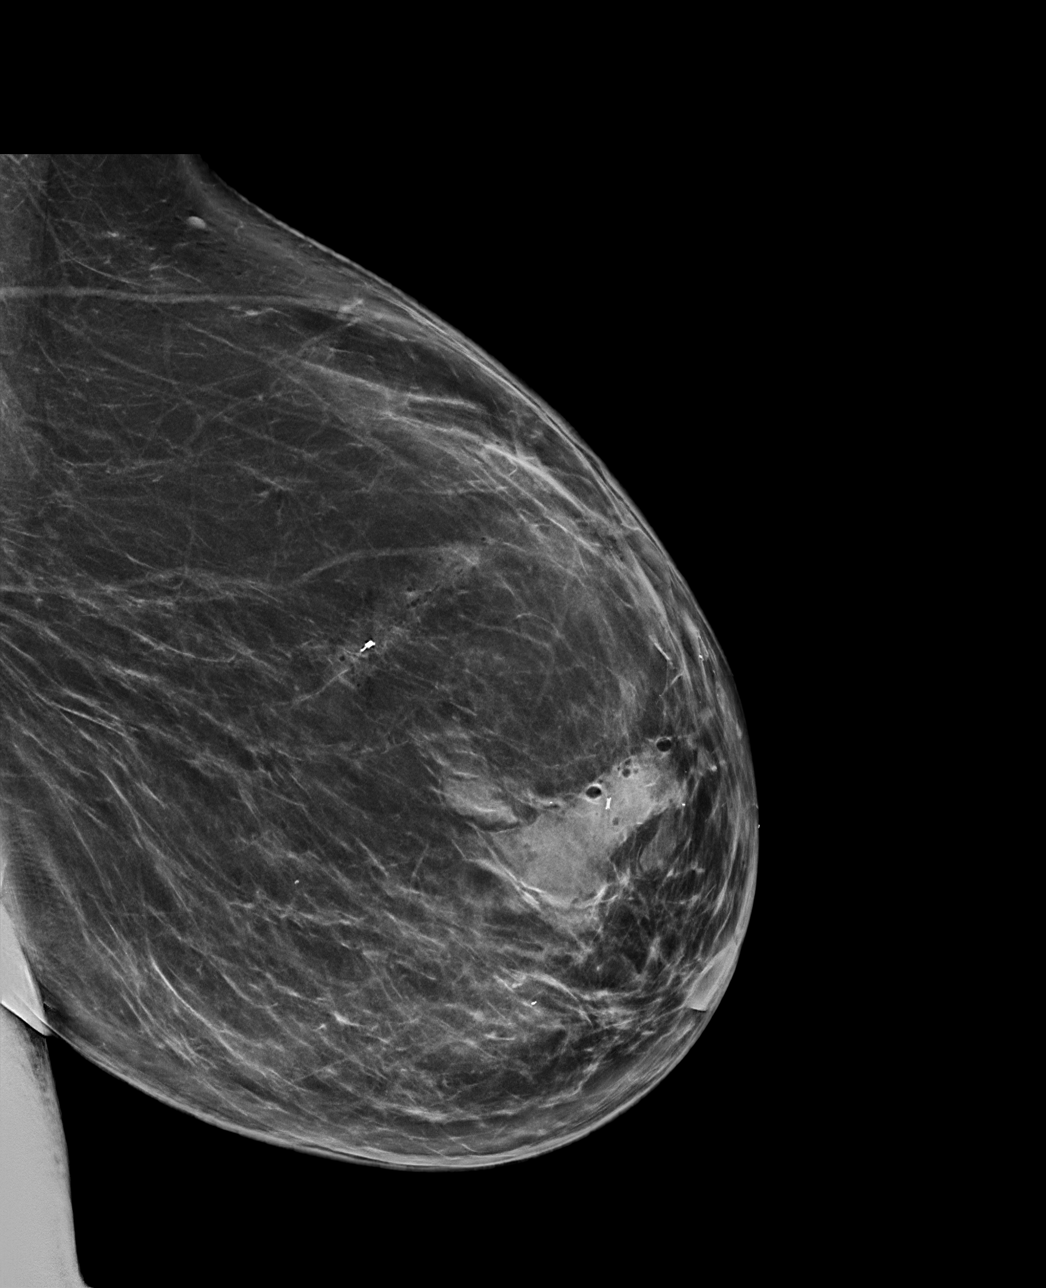

[L CC synth-2D]
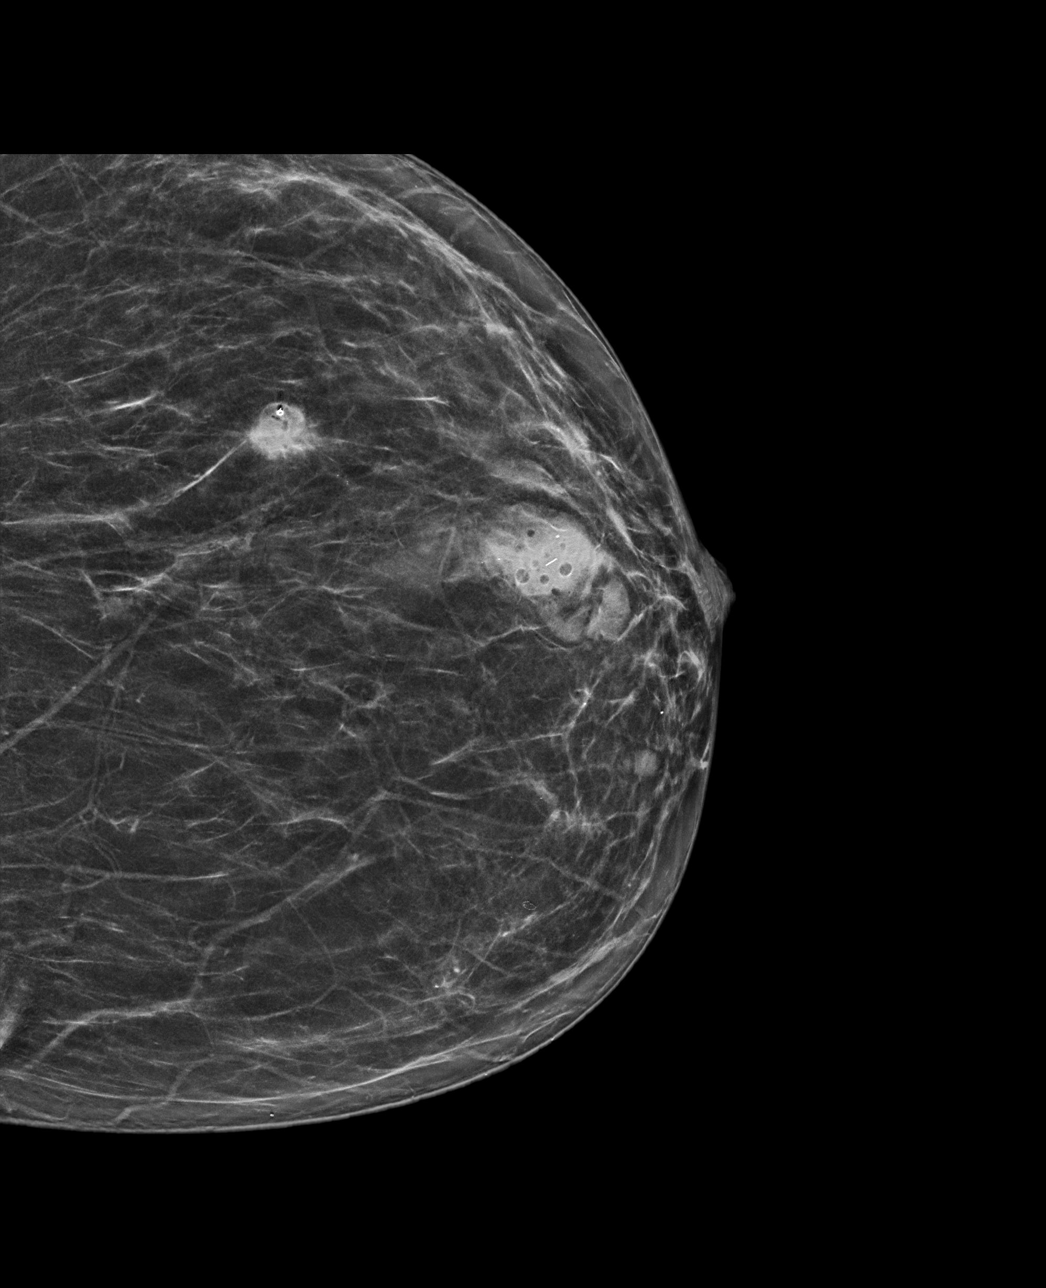

[L ML tomo · tomo slice 41/80.0]
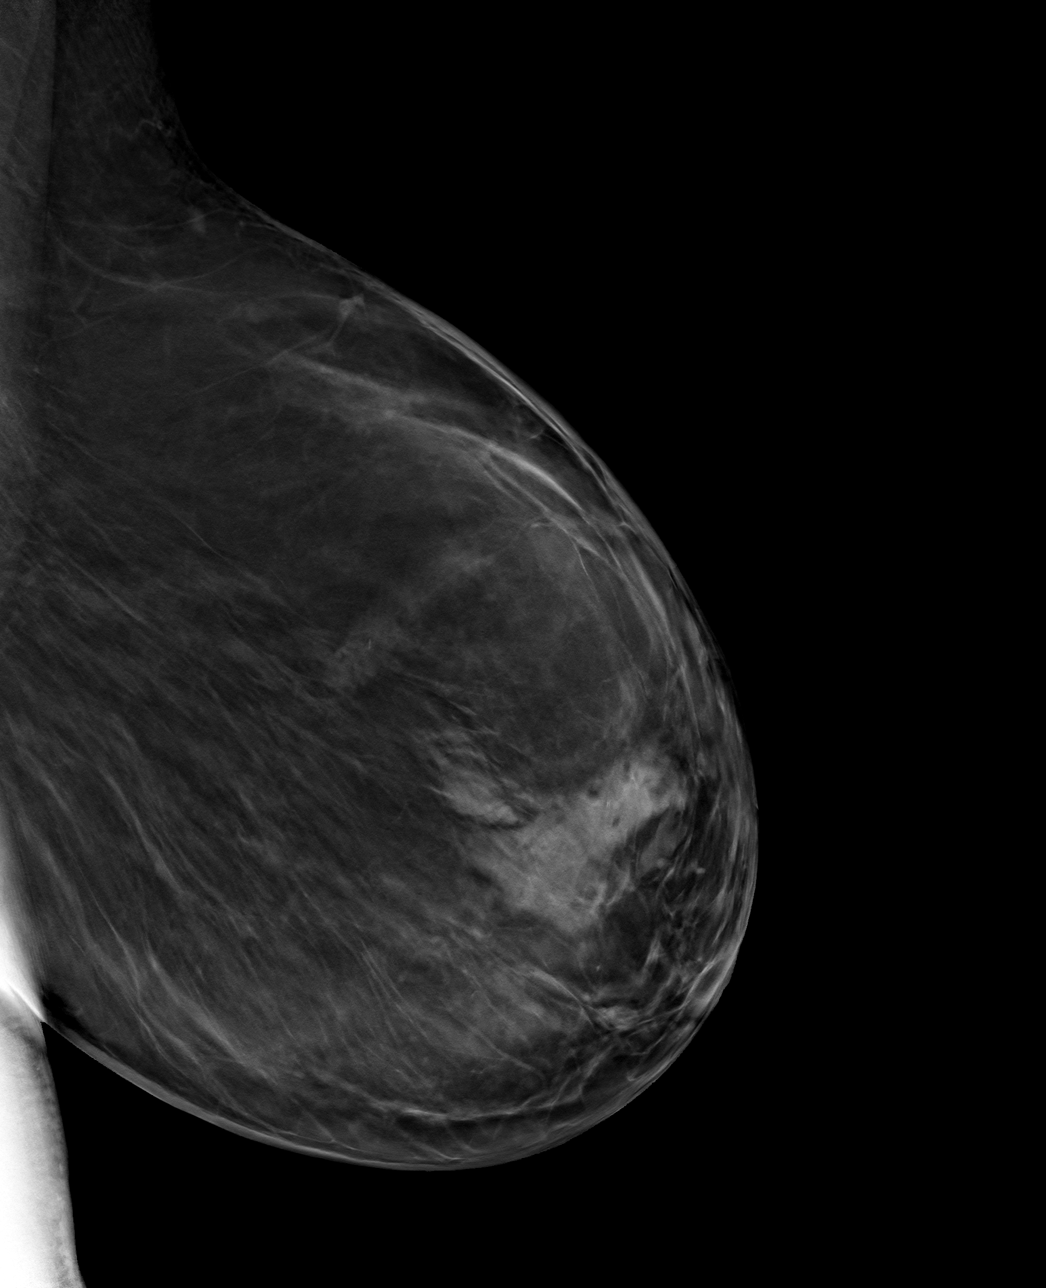

[L CC tomo · tomo slice 33/66.0]
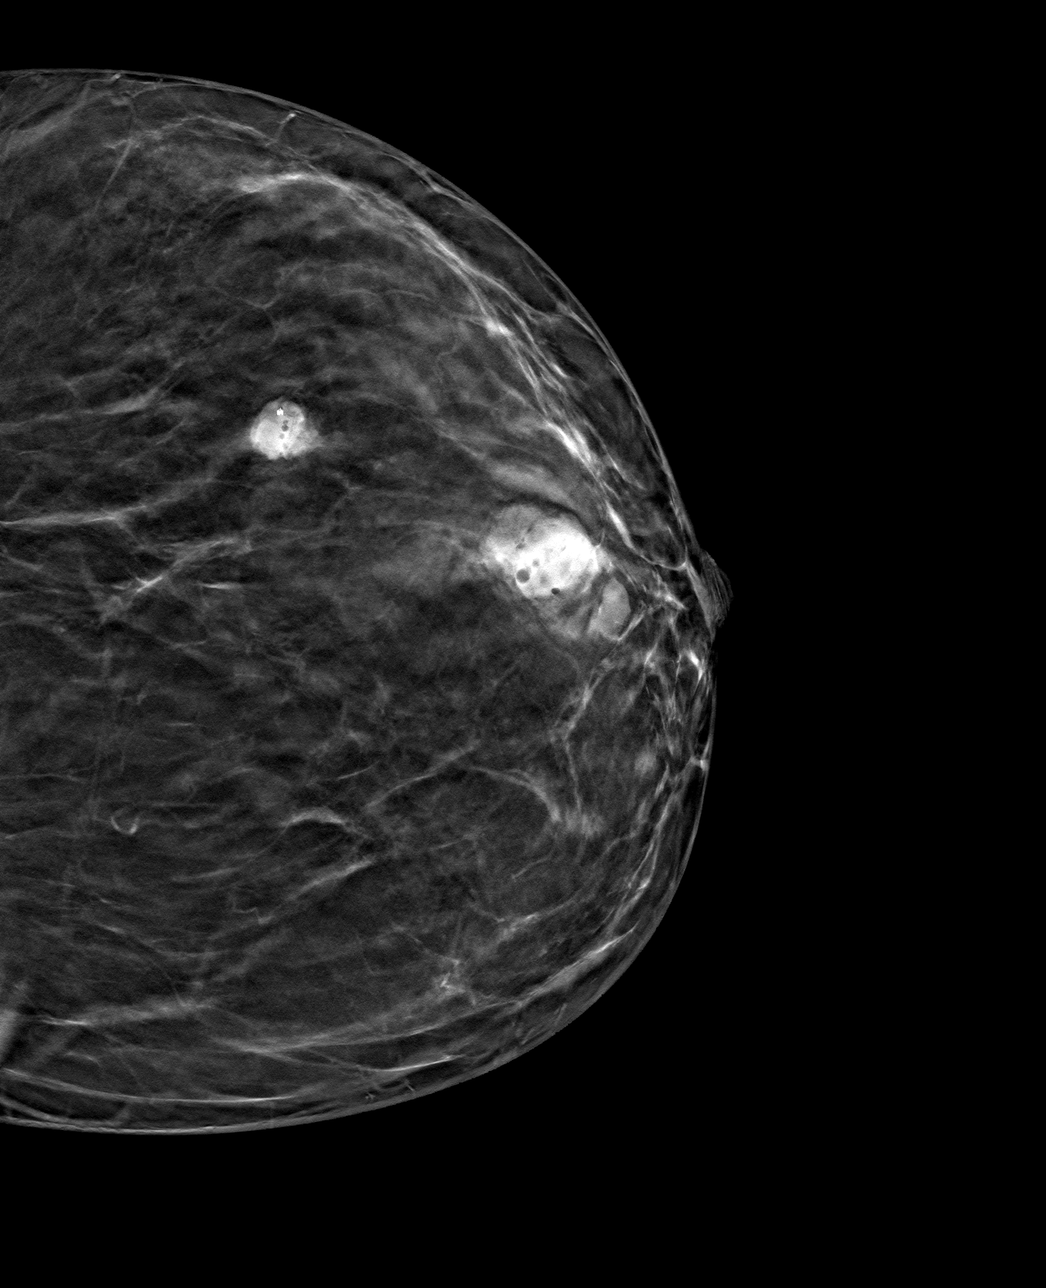

[4 of 12 positions shown; findings below may reference images not displayed]

FINDINGS: Mammographic images were obtained following stereotactic guided
biopsy of the anterior and posterior extent of left breast
calcifications. The coil shaped clip marks the posterior biopsy site
and the X shaped clip marks the anterior biopsy site. Clips are in
good position.
IMPRESSION: Appropriate positioning of the biopsy clips as above.

Final Assessment: Post Procedure Mammograms for Marker Placement

## 2020-08-20 ENCOUNTER — Telehealth: Payer: Self-pay | Admitting: Radiation Oncology

## 2020-08-20 NOTE — Progress Notes (Signed)
  Radiation Oncology         [phone]) [phone] ________________________________  Name: Nancy Alvarado MRN: [id number]  Date: 07/24/2020  DOB: [birth year]  End of Treatment Note  Diagnosis:   left-sided breast cancer     Indication for treatment:  Curative       Radiation treatment dates:   06/26/20 - 07/24/20  Site/dose:   The patient initially received a dose of 42.56 Gy in 16 fractions to the breast using whole-breast tangent fields. This was delivered using a 3-D conformal technique. The patient then received a boost to the seroma. This delivered an additional 8 Gy in 82fractions using a 3 field photon technique due to the depth of the seroma. The total dose was 50.56 Gy.   Narrative: The patient tolerated radiation treatment relatively well.   The patient had some expected skin irritation as she progressed during treatment.    Plan: The patient has completed radiation treatment. The patient will return to radiation oncology clinic for routine followup in one month. I advised the patient to call or return sooner if they have any questions or concerns related to their recovery or treatment. ________________________________  Jodelle Gross, M.D., Ph.D.

## 2020-08-20 NOTE — Telephone Encounter (Signed)
  Radiation Oncology         [phone]) [phone] ________________________________  Name: Nancy Alvarado MRN: [id number]  Date of Service:08/23/20  DOB: 10-25-1950  Post Treatment Telephone Note  Diagnosis:  Intermediate grade, ER/PR positive DCIS of the left breast.  Interval Since Last Radiation:  4 weeks   06/26/20-07/24/20: The left breast was treated ton 42.56 Gy in 16 fractions followed by an 8 Gy boost in 4 fractions.  Narrative:  The patient was contacted today for routine follow-up. During treatment she did very well with radiotherapy and did not have significant desquamation. She reports she is doing well since radiation but had a bad fall about 3 weeks ago and is in the process of seeing PT and going to have an MRI under the direction of her PCP.  Impression/Plan: 1. Intermediate grade, ER/PR positive DCIS of the left breast. The patient has been doing well since completion of radiotherapy. We discussed that we would be happy to continue to follow her as needed, but she will also continue to follow up with Dr. Lindi Adie in medical oncology. She was counseled on skin care as well as measures to avoid sun exposure to this area.  2. Survivorship. We discussed the importance of survivorship evaluation and encouraged her to attend her upcoming visit with that clinic. 3. Recent Fall. She will follow up with the scheduling of an MRI to evaluate her pelvis and legs since her fall. We will follow this expectantly.    Carola Rhine, PAC

## 2020-09-04 ENCOUNTER — Other Ambulatory Visit: Payer: Self-pay | Admitting: Family Medicine

## 2020-09-04 DIAGNOSIS — K8020 Calculus of gallbladder without cholecystitis without obstruction: Secondary | ICD-10-CM

## 2020-09-13 ENCOUNTER — Telehealth: Payer: Self-pay | Admitting: Adult Health

## 2020-09-13 NOTE — Telephone Encounter (Signed)
Attempted to leave a message to reschedule appointment due to provider admin time. Mailbox full. Mailed calendar with new appointment time w/option to call back to reschedule.

## 2020-09-26 DIAGNOSIS — H5201 Hypermetropia, right eye: Secondary | ICD-10-CM | POA: Diagnosis not present

## 2020-09-26 DIAGNOSIS — H52223 Regular astigmatism, bilateral: Secondary | ICD-10-CM | POA: Diagnosis not present

## 2020-09-26 DIAGNOSIS — H40023 Open angle with borderline findings, high risk, bilateral: Secondary | ICD-10-CM | POA: Diagnosis not present

## 2020-09-26 DIAGNOSIS — H25812 Combined forms of age-related cataract, left eye: Secondary | ICD-10-CM | POA: Diagnosis not present

## 2020-10-01 DIAGNOSIS — D0511 Intraductal carcinoma in situ of right breast: Secondary | ICD-10-CM | POA: Diagnosis not present

## 2020-10-01 DIAGNOSIS — E7800 Pure hypercholesterolemia, unspecified: Secondary | ICD-10-CM | POA: Diagnosis not present

## 2020-10-01 DIAGNOSIS — E1169 Type 2 diabetes mellitus with other specified complication: Secondary | ICD-10-CM | POA: Diagnosis not present

## 2020-10-01 DIAGNOSIS — D0512 Intraductal carcinoma in situ of left breast: Secondary | ICD-10-CM | POA: Diagnosis not present

## 2020-10-01 DIAGNOSIS — D509 Iron deficiency anemia, unspecified: Secondary | ICD-10-CM | POA: Diagnosis not present

## 2020-10-02 ENCOUNTER — Ambulatory Visit
Admission: RE | Admit: 2020-10-02 | Discharge: 2020-10-02 | Disposition: A | Discharge status: Home / Self Care | Payer: Medicare Other | Source: Ambulatory Visit | Attending: Family Medicine | Admitting: Family Medicine

## 2020-10-02 DIAGNOSIS — K760 Fatty (change of) liver, not elsewhere classified: Secondary | ICD-10-CM | POA: Diagnosis not present

## 2020-10-02 DIAGNOSIS — K8020 Calculus of gallbladder without cholecystitis without obstruction: Secondary | ICD-10-CM | POA: Diagnosis not present

## 2020-10-12 ENCOUNTER — Telehealth: Payer: Self-pay | Admitting: Adult Health

## 2020-10-12 NOTE — Telephone Encounter (Signed)
Rescheduled appt per 1/21 email/LC schedule change. Was not able to leave a voicemail because pt's voicemail box was full. Will call pt again per reminder staff msg sent to self.

## 2020-10-15 ENCOUNTER — Telehealth: Payer: Self-pay | Admitting: Adult Health

## 2020-10-15 NOTE — Telephone Encounter (Signed)
Called pt to inform of appt change. Pt confirmed new appt date and time.

## 2020-10-16 DIAGNOSIS — M5459 Other low back pain: Secondary | ICD-10-CM | POA: Diagnosis not present

## 2020-10-17 ENCOUNTER — Encounter: Payer: Medicare Other | Admitting: Adult Health

## 2020-10-18 ENCOUNTER — Other Ambulatory Visit: Payer: Self-pay

## 2020-10-18 ENCOUNTER — Inpatient Hospital Stay: Payer: Medicare Other | Attending: Adult Health | Admitting: Adult Health

## 2020-10-18 ENCOUNTER — Encounter: Payer: Self-pay | Admitting: Adult Health

## 2020-10-18 VITALS — BP 153/75 | HR 90 | Temp 97.4°F | Resp 18 | Ht 65.0 in | Wt 172.4 lb

## 2020-10-18 DIAGNOSIS — C50912 Malignant neoplasm of unspecified site of left female breast: Secondary | ICD-10-CM | POA: Diagnosis not present

## 2020-10-18 DIAGNOSIS — Z79899 Other long term (current) drug therapy: Secondary | ICD-10-CM | POA: Insufficient documentation

## 2020-10-18 DIAGNOSIS — E2839 Other primary ovarian failure: Secondary | ICD-10-CM | POA: Diagnosis not present

## 2020-10-18 DIAGNOSIS — Z923 Personal history of irradiation: Secondary | ICD-10-CM | POA: Insufficient documentation

## 2020-10-18 DIAGNOSIS — Z170 Estrogen receptor positive status [ER+]: Secondary | ICD-10-CM | POA: Diagnosis not present

## 2020-10-18 DIAGNOSIS — D0512 Intraductal carcinoma in situ of left breast: Secondary | ICD-10-CM

## 2020-10-18 DIAGNOSIS — Z79811 Long term (current) use of aromatase inhibitors: Secondary | ICD-10-CM | POA: Diagnosis not present

## 2020-10-18 NOTE — Progress Notes (Signed)
SURVIVORSHIP VISIT:  BRIEF ONCOLOGIC HISTORY:  Oncology History  Ductal carcinoma in situ (DCIS) of left breast  03/08/2020 Initial Diagnosis   Screening mammogram detected Left breast calcs 7.4 cm UOQ, biopsy (anterior and posterior) revealed IG DCIS ER 100%, PR 0%   04/09/2020 Cancer Staging   Staging form: Breast, AJCC 8th Edition - Clinical stage from 04/09/2020: Stage 0 (cTis (DCIS), cN0, cM0, ER+, PR-, HER2: Not Assessed)   05/25/2020 Surgery   Left lumpectomy Lucia Gaskins) [id number]): DCIS with necrosis and calcifications, 1.5cm, grade 2, clear margins. No lymph nodes were examined.   06/26/2020 - 07/24/2020 Radiation Therapy   The patient initially received a dose of 42.56 Gy in 16 fractions to the breast using whole-breast tangent fields. This was delivered using a 3-D conformal technique. The patient then received a boost to the seroma. This delivered an additional 8 Gy in 65fractions using a 3 field photon technique due to the depth of the seroma. The total dose was 50.56 Gy.   07/2020 - 07/2025 Anti-estrogen oral therapy   Anastrozole   09/29/2020 Cancer Staging   Staging form: Breast, AJCC 8th Edition - Pathologic: Stage 0 (pTis (DCIS), pN0, cM0)     INTERVAL HISTORY:  Ms. Brandel to review her survivorship care plan detailing her treatment course for breast cancer, as well as monitoring long-term side effects of that treatment, education regarding health maintenance, screening, and overall wellness and health promotion.     Overall, Ms. Huizar reports feeling quite well.  She is taking Anastrozole daily and is tolerating this without difficulty.  She denies any fatigue, difficulty sleeping, vaginal dryness, or hot flashes.  She notes she is healing from her radiation therapy well.  She has no concerns today.  REVIEW OF SYSTEMS:  Review of Systems  Constitutional: Negative for appetite change, chills, fatigue, fever and unexpected weight change.  HENT:   Negative for hearing  loss, lump/mass and trouble swallowing.   Eyes: Negative for eye problems and icterus.  Respiratory: Negative for chest tightness, cough and shortness of breath.   Cardiovascular: Negative for chest pain, leg swelling and palpitations.  Gastrointestinal: Negative for abdominal distention, abdominal pain, constipation, diarrhea, nausea and vomiting.  Endocrine: Negative for hot flashes.  Genitourinary: Negative for difficulty urinating.   Musculoskeletal: Negative for arthralgias.  Skin: Negative for itching and rash.  Neurological: Negative for dizziness, extremity weakness, headaches and numbness.  Hematological: Negative for adenopathy. Does not bruise/bleed easily.  Psychiatric/Behavioral: Negative for depression. The patient is not nervous/anxious.   Breast: Denies any new nodularity, masses, tenderness, nipple changes, or nipple discharge.      ONCOLOGY TREATMENT TEAM:  1. Surgeon:  Dr. Lucia Gaskins at [facility] 2. Medical Oncologist: Dr. Lindi Adie  3. Radiation Oncologist: Dr. Lisbeth Renshaw    PAST MEDICAL/SURGICAL HISTORY:  Past Medical History:  Diagnosis Date  . Breast cancer ([place]) 02/2020   Left Breast  . Diabetes mellitus without complication ([place])   . High cholesterol    Past Surgical History:  Procedure Laterality Date  . BREAST LUMPECTOMY WITH RADIOACTIVE SEED LOCALIZATION Left 05/25/2020   Procedure: LEFT BREAST LUMPECTOMY WITH RADIOACTIVE SEED LOCALIZATION X 2;  Surgeon: Alphonsa Overall, MD;  Location: [place];  Service: General;  Laterality: Left;  . OVARIAN CYST REMOVAL       ALLERGIES:  No Known Allergies   CURRENT MEDICATIONS:  Outpatient Encounter Medications as of 10/18/2020  Medication Sig  . anastrozole (ARIMIDEX) 1 MG tablet Take 1 tablet (1 mg total) by  mouth daily.  Marland Kitchen atorvastatin (LIPITOR) 20 MG tablet Take 20 mg by mouth daily.  . ferrous sulfate 325 (65 FE) MG tablet Take 325 mg by mouth daily with breakfast.  . metFORMIN  (GLUCOPHAGE) 500 MG tablet Take 1 tablet (500 mg total) by mouth daily with breakfast.  . ONETOUCH ULTRA test strip every morning.   No facility-administered encounter medications on file as of 10/18/2020.     ONCOLOGIC FAMILY HISTORY:  Family History  Problem Relation Age of Onset  . Cancer Mother   . Diabetes Mother   . Heart disease Mother   . Diabetes Father   . Heart disease Father   . Hypertension Sister   . Diabetes Brother   . Hypertension Brother   . Stroke Brother      GENETIC COUNSELING/TESTING: Not at this time  SOCIAL HISTORY:  Social History   Socioeconomic History  . Marital status: Married    Spouse name: Not on file  . Number of children: Not on file  . Years of education: Not on file  . Highest education level: Not on file  Occupational History  . Not on file  Tobacco Use  . Smoking status: Never Smoker  . Smokeless tobacco: Never Used  Substance and Sexual Activity  . Alcohol use: No  . Drug use: No  . Sexual activity: Not on file  Other Topics Concern  . Not on file  Social History Narrative  . Not on file   Social Determinants of Health   Financial Resource Strain: Not on file  Food Insecurity: Not on file  Transportation Needs: Not on file  Physical Activity: Not on file  Stress: Not on file  Social Connections: Not on file  Intimate Partner Violence: Not on file     OBSERVATIONS/OBJECTIVE:  BP (!) 153/75   Pulse 90   Temp (!) 97.4 F (36.3 C) (Tympanic)   Resp 18   Ht $R'5\' 5"'dW$  (1.651 m)   Wt 172 lb 6.4 oz (78.2 kg)   SpO2 100%   BMI 28.69 kg/m  GENERAL: Patient is a well appearing female in no acute distress HEENT:  Sclerae anicteric.  Mask in place. Neck is supple.  NODES:  No cervical, supraclavicular, or axillary lymphadenopathy palpated.  BREAST EXAM:  Left breast s/p lumpectomy and radiation, no sign of local recurrence, right breast benign LUNGS:  Clear to auscultation bilaterally.  No wheezes or rhonchi. HEART:   Regular rate and rhythm. No murmur appreciated. ABDOMEN:  Soft, nontender.  Positive, normoactive bowel sounds. No organomegaly palpated. MSK:  No focal spinal tenderness to palpation. Full range of motion bilaterally in the upper extremities. EXTREMITIES:  No peripheral edema.   SKIN:  Clear with no obvious rashes or skin changes. No nail dyscrasia. NEURO:  Nonfocal. Well oriented.  Appropriate affect.   LABORATORY DATA:  None for this visit.  DIAGNOSTIC IMAGING:  None for this visit.      ASSESSMENT AND PLAN:  Ms.. Hertenstein is a pleasant 70 y.o. female with Stage 0 left breast invasive ductal carcinoma, ER+/PR-, diagnosed in 03/2020, treated with lumpectomy, adjuvant radiation therapy, and anti-estrogen therapy with Anastrozole beginning in 07/2020.  She presents to the Survivorship Clinic for our initial meeting and routine follow-up post-completion of treatment for breast cancer.    1. Stage 0 left breast cancer:  Ms. Daughety is continuing to recover from definitive treatment for breast cancer. She will follow-up with her medical oncologist, Dr. Cleda Daub 6 months with history and physical  exam per surveillance protocol.  She will continue her anti-estrogen therapy with Anastrozole. Thus far, she is tolerating the Anastrozole well, with minimal side effects. She was instructed to make Dr. Lindi Adie or myself aware if she begins to experience any worsening side effects of the medication and I could see her back in clinic to help manage those side effects, as needed.   Her mammogram is due 01/2021; orders placed today. Today, a comprehensive survivorship care plan and treatment summary was reviewed with the patient today detailing her breast cancer diagnosis, treatment course, potential late/long-term effects of treatment, appropriate follow-up care with recommendations for the future, and patient education resources.  A copy of this summary, along with a letter will be sent to the patient's primary  care provider via mail/fax/In Basket message after today's visit.    2. Bone health:  Given Ms. Saffran's age/history of breast cancer and her current treatment regimen including anti-estrogen therapy with Anastrozole, she is at risk for bone demineralization. She has not undergone bone density testing and I placed orders for this to be completed in 01/2021 when she has her mammogram. She was given education on specific activities to promote bone health.  3. Cancer screening:  Due to Ms. Zuleta's history and her age, she should receive screening for skin cancers, colon cancer, and gynecologic cancers.  The information and recommendations are listed on the patient's comprehensive care plan/treatment summary and were reviewed in detail with the patient.    4. Health maintenance and wellness promotion: Ms. Thornell was encouraged to consume 5-7 servings of fruits and vegetables per day. We reviewed the "Nutrition Rainbow" handout, as well as the handout "Take Control of Your Health and Reduce Your Cancer Risk" from the [place].  She was also encouraged to engage in moderate to vigorous exercise for 30 minutes per day most days of the week. We discussed the [employer] fitness program, which is designed for cancer survivors to help them become more physically fit after cancer treatments.  She was instructed to limit her alcohol consumption and continue to abstain from tobacco use.     5. Support services/counseling: It is not uncommon for this period of the patient's cancer care trajectory to be one of many emotions and stressors.  We discussed how this can be increasingly difficult during the times of quarantine and social distancing due to the COVID-19 pandemic.   She was given information regarding our available services and encouraged to contact me with any questions or for help enrolling in any of our support group/programs.    Follow up instructions:    -Return to cancer center 6  months for f/u with Dr. Lindi Adie  -Mammogram due in 01/2021 -She is welcome to return back to the Survivorship Clinic at any time; no additional follow-up needed at this time.  -Consider referral back to survivorship as a long-term survivor for continued surveillance  The patient was provided an opportunity to ask questions and all were answered. The patient agreed with the plan and demonstrated an understanding of the instructions.   Total encounter time: 30 minutes*  Wilber Bihari, NP 10/18/20 9:57 AM Medical Oncology and Hematology [facility], [place] [address] Tel. [phone]    Fax. [phone]  *Total Encounter Time as defined by the Centers for Medicare and Medicaid Services includes, in addition to the face-to-face time of a patient visit (documented in the note above) non-face-to-face time: obtaining and reviewing outside history, ordering and  reviewing medications, tests or procedures, care coordination (communications with other health care professionals or caregivers) and documentation in the medical record.  

## 2020-11-14 ENCOUNTER — Ambulatory Visit: Payer: Self-pay | Admitting: Surgery

## 2020-11-14 DIAGNOSIS — K8020 Calculus of gallbladder without cholecystitis without obstruction: Secondary | ICD-10-CM | POA: Diagnosis not present

## 2020-11-15 DIAGNOSIS — D0512 Intraductal carcinoma in situ of left breast: Secondary | ICD-10-CM | POA: Diagnosis not present

## 2020-11-15 DIAGNOSIS — E1169 Type 2 diabetes mellitus with other specified complication: Secondary | ICD-10-CM | POA: Diagnosis not present

## 2020-11-15 DIAGNOSIS — D0511 Intraductal carcinoma in situ of right breast: Secondary | ICD-10-CM | POA: Diagnosis not present

## 2020-11-15 DIAGNOSIS — D509 Iron deficiency anemia, unspecified: Secondary | ICD-10-CM | POA: Diagnosis not present

## 2020-11-15 DIAGNOSIS — E7800 Pure hypercholesterolemia, unspecified: Secondary | ICD-10-CM | POA: Diagnosis not present

## 2020-12-24 NOTE — Progress Notes (Signed)
DUE TO COVID-19 ONLY ONE VISITOR IS ALLOWED TO COME WITH YOU AND STAY IN THE WAITING ROOM ONLY DURING PRE OP AND PROCEDURE DAY OF SURGERY. THE 1 VISITOR  MAY VISIT WITH YOU AFTER SURGERY IN YOUR PRIVATE ROOM DURING VISITING HOURS ONLY!  YOU NEED TO HAVE A COVID 19 TEST ON_4/11/22______ @_______ , THIS TEST MUST BE DONE BEFORE SURGERY,  COVID TESTING SITE [address] [place] [address], IT IS ON THE RIGHT GOING OUT [address] APPROXIMATELY  2 MINUTES PAST [employer] ON THE RIGHT. ONCE YOUR COVID TEST IS COMPLETED,  PLEASE BEGIN THE QUARANTINE INSTRUCTIONS AS OUTLINED IN YOUR HANDOUT.                Nancy ZURCHER  12/24/2020   Your procedure is scheduled on:  01/03/2021   Report to [facility] Main  Entrance   Report to admitting at     0800 AM     Call this number if you have problems the morning of surgery [phone]    REMEMBER: NO  SOLID FOOD CANDY OR GUM AFTER MIDNIGHT. CLEAR LIQUIDS UNTIL   0700am       . NOTHING BY MOUTH EXCEPT CLEAR LIQUIDS UNTIL 0700am    . PLEASE FINISH ENSURE DRINK PER SURGEON ORDER  WHICH NEEDS TO BE COMPLETED AT  0700am    CLEAR LIQUID DIET   Foods Allowed                                                                    Coffee and tea, regular and decaf                            Fruit ices (not with fruit pulp)                                      Iced Popsicles                                    Carbonated beverages, regular and diet                                    Cranberry, grape and apple juices Sports drinks like Gatorade Lightly seasoned clear broth or consume(fat free) Sugar, honey syrup ___________________________________________________________________      BRUSH YOUR TEETH MORNING OF SURGERY AND RINSE YOUR MOUTH OUT, NO CHEWING GUM CANDY OR MINTS.     Take these medicines the morning of surgery with A SIP OF WATER:  arimidex  DO NOT TAKE ANY DIABETIC MEDICATIONS DAY OF YOUR SURGERY                                You may not have any metal on your body including hair pins and              piercings  Do not wear jewelry, make-up, lotions, powders or  perfumes, deodorant             Do not wear nail polish on your fingernails.  Do not shave  48 hours prior to surgery.              Men may shave face and neck.   Do not bring valuables to the hospital. [place].  Contacts, dentures or bridgework may not be worn into surgery.  Leave suitcase in the car. After surgery it may be brought to your room.     Patients discharged the day of surgery will not be allowed to drive home. IF YOU ARE HAVING SURGERY AND GOING HOME THE SAME DAY, YOU MUST HAVE AN ADULT TO DRIVE YOU HOME AND BE WITH YOU FOR 24 HOURS. YOU MAY GO HOME BY TAXI OR UBER OR ORTHERWISE, BUT AN ADULT MUST ACCOMPANY YOU HOME AND STAY WITH YOU FOR 24 HOURS.  Name and phone number of your driver:  Special Instructions: N/A              Please read over the following fact sheets you were given: _____________________________________________________________________  [facility] - Preparing for Surgery Before surgery, you can play an important role.  Because skin is not sterile, your skin needs to be as free of germs as possible.  You can reduce the number of germs on your skin by washing with CHG (chlorahexidine gluconate) soap before surgery.  CHG is an antiseptic cleaner which kills germs and bonds with the skin to continue killing germs even after washing. Please DO NOT use if you have an allergy to CHG or antibacterial soaps.  If your skin becomes reddened/irritated stop using the CHG and inform your nurse when you arrive at Short Stay. Do not shave (including legs and underarms) for at least 48 hours prior to the first CHG shower.  You may shave your face/neck. Please follow these instructions carefully:  1.  Shower with CHG Soap the night before surgery and the  morning of  Surgery.  2.  If you choose to wash your hair, wash your hair first as usual with your  normal  shampoo.  3.  After you shampoo, rinse your hair and body thoroughly to remove the  shampoo.                           4.  Use CHG as you would any other liquid soap.  You can apply chg directly  to the skin and wash                       Gently with a scrungie or clean washcloth.  5.  Apply the CHG Soap to your body ONLY FROM THE NECK DOWN.   Do not use on face/ open                           Wound or open sores. Avoid contact with eyes, ears mouth and genitals (private parts).                       Wash face,  Genitals (private parts) with your normal soap.             6.  Wash thoroughly, paying special attention to the area where  your surgery  will be performed.  7.  Thoroughly rinse your body with warm water from the neck down.  8.  DO NOT shower/wash with your normal soap after using and rinsing off  the CHG Soap.                9.  Pat yourself dry with a clean towel.            10.  Wear clean pajamas.            11.  Place clean sheets on your bed the night of your first shower and do not  sleep with pets. Day of Surgery : Do not apply any lotions/deodorants the morning of surgery.  Please wear clean clothes to the hospital/surgery center.  FAILURE TO FOLLOW THESE INSTRUCTIONS MAY RESULT IN THE CANCELLATION OF YOUR SURGERY PATIENT SIGNATURE_________________________________  NURSE SIGNATURE__________________________________  ________________________________________________________________________

## 2020-12-27 ENCOUNTER — Encounter (HOSPITAL_COMMUNITY): Payer: Self-pay

## 2020-12-27 ENCOUNTER — Encounter (HOSPITAL_COMMUNITY)
Admission: RE | Admit: 2020-12-27 | Discharge: 2020-12-27 | Disposition: A | Discharge status: Home / Self Care | Payer: Medicare Other | Source: Ambulatory Visit | Attending: Surgery | Admitting: Surgery

## 2020-12-27 ENCOUNTER — Other Ambulatory Visit: Payer: Self-pay

## 2020-12-27 DIAGNOSIS — Z01812 Encounter for preprocedural laboratory examination: Secondary | ICD-10-CM | POA: Diagnosis not present

## 2020-12-27 HISTORY — DX: Cardiac murmur, unspecified: R01.1

## 2020-12-27 HISTORY — DX: Pneumonia, unspecified organism: J18.9

## 2020-12-27 HISTORY — DX: Anemia, unspecified: D64.9

## 2020-12-27 HISTORY — DX: Personal history of urinary calculi: Z87.442

## 2020-12-27 LAB — CBC
HCT: 36.7 % (ref 36.0–46.0)
Hemoglobin: 11.1 g/dL — ABNORMAL LOW (ref 12.0–15.0)
MCH: 25.5 pg — ABNORMAL LOW (ref 26.0–34.0)
MCHC: 30.2 g/dL (ref 30.0–36.0)
MCV: 84.4 fL (ref 80.0–100.0)
Platelets: 299 10*3/uL (ref 150–400)
RBC: 4.35 MIL/uL (ref 3.87–5.11)
RDW: 17.4 % — ABNORMAL HIGH (ref 11.5–15.5)
WBC: 9.7 10*3/uL (ref 4.0–10.5)
nRBC: 0 % (ref 0.0–0.2)

## 2020-12-27 LAB — BASIC METABOLIC PANEL
Anion gap: 9 (ref 5–15)
BUN: 15 mg/dL (ref 8–23)
CO2: 27 mmol/L (ref 22–32)
Calcium: 9 mg/dL (ref 8.9–10.3)
Chloride: 105 mmol/L (ref 98–111)
Creatinine, Ser: 0.66 mg/dL (ref 0.44–1.00)
GFR, Estimated: >60 mL/min (ref >60)
Glucose, Bld: 188 mg/dL — ABNORMAL HIGH (ref 70–99)
Potassium: 4 mmol/L (ref 3.5–5.1)
Sodium: 141 mmol/L (ref 135–145)

## 2020-12-27 LAB — HEMOGLOBIN A1C
Hgb A1c MFr Bld: 7.3 % — ABNORMAL HIGH (ref 4.8–5.6)
Mean Plasma Glucose: 162.81 mg/dL

## 2020-12-27 LAB — GLUCOSE, CAPILLARY: Glucose-Capillary: 185 mg/dL — ABNORMAL HIGH (ref 70–99)

## 2020-12-27 NOTE — Progress Notes (Signed)
Anesthesia Review:  PCP: Elon Spanner Cardiologist : Chest x-ray : EKG :05/22/20 Echo : Stress test: Cardiac Cath :  Activity level: can do a flight of stairs without difficulty  Sleep Study/ CPAP :no  Fasting Blood Sugar :      / Checks Blood Sugar -- times a day:   Blood Thinner/ Instructions /Last Dose: ASA / Instructions/ Last Dose :  DM- type 2  12/27/20- hgba`1c-

## 2020-12-31 ENCOUNTER — Other Ambulatory Visit (HOSPITAL_COMMUNITY)
Admission: RE | Admit: 2020-12-31 | Discharge: 2020-12-31 | Disposition: A | Discharge status: Home / Self Care | Payer: Medicare Other | Source: Ambulatory Visit | Attending: Surgery | Admitting: Surgery

## 2020-12-31 DIAGNOSIS — Z20822 Contact with and (suspected) exposure to covid-19: Secondary | ICD-10-CM | POA: Diagnosis not present

## 2020-12-31 DIAGNOSIS — Z01812 Encounter for preprocedural laboratory examination: Secondary | ICD-10-CM | POA: Diagnosis not present

## 2020-12-31 LAB — SARS CORONAVIRUS 2 (TAT 6-24 HRS): SARS Coronavirus 2: NEGATIVE

## 2021-01-01 ENCOUNTER — Encounter (HOSPITAL_COMMUNITY): Payer: Self-pay | Admitting: Surgery

## 2021-01-01 DIAGNOSIS — K8010 Calculus of gallbladder with chronic cholecystitis without obstruction: Secondary | ICD-10-CM | POA: Diagnosis present

## 2021-01-01 NOTE — H&P (Signed)
General Surgery [facility]  Nancy Alvarado DOB: 1951/04/28 Married / Language: English / Race: Black or African American Female   History of Present Illness   The patient is a 70 year old female who presents for evaluation of gall stones.  CHIEF COMPLAINT: cholelithiasis  Patient is referred by her primary care physician, Dr. Yaakov Guthrie, for surgical management of cholelithiasis. Patient was found on an incidental radiographic study in November 2021 to have cholelithiasis. Patient has had minimal symptoms. She does have type 2 diabetes. Ultrasound examination in January 2022 showed multiple gallstones in the largest measuring 1.4 cm in diameter. There were no acute inflammatory changes. There is no biliary dilatation. There was mild increase in echogenicity of the liver consistent with fatty liver. Patient denies any history of jaundice or acholic stools. She has no prior history of hepatobiliary or pancreatic disease. She has had previous gynecologic surgery on the abdomen. Patient denies any fatty food intolerance. Her primary care physician recommended cholecystectomy due to her age, medical conditions, and diabetes. Patient presents today to discuss cholecystectomy.   Problem List/Past Medical DIABETES MELLITUS (E11.9)  BREAST CANCER, STAGE 0, LEFT (D05.92)  Left breast biopsy x 2 - 6/17/202 ([id number]) - 1. 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  Diagnostic Studies History  Colonoscopy  5-10 years ago Mammogram  within last year Pap Smear  1-5 years ago  Allergies  No Known Drug Allergies   Medication History  OneTouch Ultra (In Vitro) Active. Ferrous Sulfate (325 (65 Fe)MG Tablet, Oral) Active. Atorvastatin Calcium (20MG  Tablet, Oral) Active. Iron (325 (65 Fe)MG Tablet, Oral) Active. metFORMIN HCl  ER (500MG  Tablet ER 24HR, Oral) Active. Medications Reconciled  Pregnancy / Birth History  Age at menarche  67 years. Age of menopause  68-60 Gravida  6 Para  5  Other Problems  Back Pain  Breast Cancer  Diabetes Mellitus   Vitals  Weight: 176.38 lb Height: 65in Body Surface Area: 1.88 m Body Mass Index: 29.35 kg/m  Temp.: 98.28F  Pulse: 106 (Regular)  P.OX: 93% (Room air) BP: 160/80(Sitting, Left Arm, Standard)  Physical Exam   GENERAL APPEARANCE Development: normal Nutritional status: normal Gross deformities: none  SKIN Rash, lesions, ulcers: none Induration, erythema: none Nodules: none palpable  EYES Conjunctiva and lids: normal Pupils: equal and reactive Iris: normal bilaterally  EARS, NOSE, MOUTH, THROAT External ears: no lesion or deformity External nose: no lesion or deformity Hearing: grossly normal Due to Covid-19 pandemic, patient is wearing a mask.  NECK Symmetric: yes Trachea: midline Thyroid: no palpable nodules in the thyroid bed  CHEST Respiratory effort: normal Retraction or accessory muscle use: no Breath sounds: normal bilaterally Rales, rhonchi, wheeze: none  CARDIOVASCULAR Auscultation: regular rhythm, normal rate Murmurs: none Pulses: radial pulse 2+ palpable Lower extremity edema: none  ABDOMEN Distension: none Masses: none palpable Tenderness: none Hepatosplenomegaly: not present Hernia: not present Well-healed surgical incision left lower quadrant paramedian  MUSCULOSKELETAL Station and gait: normal Digits and nails: no clubbing or cyanosis Muscle strength: grossly normal all extremities Range of motion: grossly normal all extremities Deformity: none  LYMPHATIC Cervical: none palpable Supraclavicular: none palpable  PSYCHIATRIC Oriented to person, place, and time: yes Mood and affect: normal for situation Judgment and insight: appropriate for situation    Assessment &  Plan  CALCULUS OF GALLBLADDER WITHOUT  CHOLECYSTITIS WITHOUT OBSTRUCTION (K80.20)  Patient is referred by her primary care physician for evaluation for cholecystectomy. Patient is presented with written literature on gallbladder surgery to review at home.  Patient has had minimal symptoms. She does have radiographic evidence of multiple gallstones. She may have mild fatty infiltration of the liver. Patient does have risk factors given her age and the presence of diabetes. Patient would like to proceed with cholecystectomy. She has been recommended to have cholecystectomy by her primary care physician. We have discussed the fact that she does not have an absolute indication for cholecystectomy but certainly is at risk for complications in the future. She would like to proceed with surgery.  We discussed laparoscopic cholecystectomy with intraoperative cholangiography. We discussed the risk and benefits of the procedure. We discussed doing this with an overnight hospital stay. We discussed her postoperative recovery and return to normal activities and normal diet. She understands and wishes to proceed in the near future.  The risks and benefits of the procedure have been discussed at length with the patient. The patient understands the proposed procedure, potential alternative treatments, and the course of recovery to be expected. All of the patient's questions have been answered at this time. The patient wishes to proceed with surgery.  Armandina Gemma, MD [facility] Office: [phone]

## 2021-01-03 ENCOUNTER — Ambulatory Visit (HOSPITAL_COMMUNITY): Payer: Medicare Other | Admitting: Certified Registered Nurse Anesthetist

## 2021-01-03 ENCOUNTER — Ambulatory Visit (HOSPITAL_COMMUNITY): Payer: Medicare Other

## 2021-01-03 ENCOUNTER — Ambulatory Visit (HOSPITAL_COMMUNITY)
Admission: RE | Admit: 2021-01-03 | Discharge: 2021-01-04 | Disposition: A | Discharge status: Home / Self Care | Payer: Medicare Other | Attending: Surgery | Admitting: Surgery

## 2021-01-03 ENCOUNTER — Encounter (HOSPITAL_COMMUNITY)
Admission: RE | Disposition: A | Discharge status: Home / Self Care | Payer: Self-pay | Source: Home / Self Care | Attending: Surgery

## 2021-01-03 ENCOUNTER — Encounter (HOSPITAL_COMMUNITY): Payer: Self-pay | Admitting: Surgery

## 2021-01-03 DIAGNOSIS — Z853 Personal history of malignant neoplasm of breast: Secondary | ICD-10-CM | POA: Diagnosis not present

## 2021-01-03 DIAGNOSIS — E119 Type 2 diabetes mellitus without complications: Secondary | ICD-10-CM | POA: Insufficient documentation

## 2021-01-03 DIAGNOSIS — K8010 Calculus of gallbladder with chronic cholecystitis without obstruction: Secondary | ICD-10-CM | POA: Diagnosis not present

## 2021-01-03 DIAGNOSIS — Z9049 Acquired absence of other specified parts of digestive tract: Secondary | ICD-10-CM | POA: Diagnosis not present

## 2021-01-03 DIAGNOSIS — K8020 Calculus of gallbladder without cholecystitis without obstruction: Secondary | ICD-10-CM | POA: Diagnosis not present

## 2021-01-03 DIAGNOSIS — Z419 Encounter for procedure for purposes other than remedying health state, unspecified: Secondary | ICD-10-CM

## 2021-01-03 DIAGNOSIS — Z7984 Long term (current) use of oral hypoglycemic drugs: Secondary | ICD-10-CM | POA: Diagnosis not present

## 2021-01-03 DIAGNOSIS — E7800 Pure hypercholesterolemia, unspecified: Secondary | ICD-10-CM | POA: Diagnosis not present

## 2021-01-03 HISTORY — PX: CHOLECYSTECTOMY: SHX55

## 2021-01-03 LAB — GLUCOSE, CAPILLARY
Glucose-Capillary: 178 mg/dL — ABNORMAL HIGH (ref 70–99)
Glucose-Capillary: 167 mg/dL — ABNORMAL HIGH (ref 70–99)
Glucose-Capillary: 175 mg/dL — ABNORMAL HIGH (ref 70–99)
Glucose-Capillary: 119 mg/dL — ABNORMAL HIGH (ref 70–99)

## 2021-01-03 SURGERY — LAPAROSCOPIC CHOLECYSTECTOMY WITH INTRAOPERATIVE CHOLANGIOGRAM
Anesthesia: General

## 2021-01-03 MED ORDER — LIDOCAINE 2% (20 MG/ML) 5 ML SYRINGE
INTRAMUSCULAR | Status: DC | PRN
  Administered 2021-01-03: 60 mg via INTRAVENOUS

## 2021-01-03 MED ORDER — CHLORHEXIDINE GLUCONATE CLOTH 2 % EX PADS: 6.0000 | MEDICATED_PAD | Freq: Once | CUTANEOUS | Status: DC

## 2021-01-03 MED ORDER — ONDANSETRON HCL 4 MG/2ML IJ SOLN: 4.0000 mg | Freq: Four times a day (QID) | INTRAMUSCULAR | Status: DC | PRN

## 2021-01-03 MED ORDER — ACETAMINOPHEN 325 MG PO TABS
650.0000 mg | ORAL_TABLET | Freq: Four times a day (QID) | ORAL | Status: DC | PRN
  Administered 2021-01-03: 650 mg via ORAL
  Filled 2021-01-03: qty 2

## 2021-01-03 MED ORDER — FENTANYL CITRATE (PF) 100 MCG/2ML IJ SOLN
INTRAMUSCULAR | Status: AC
  Filled 2021-01-03: qty 2

## 2021-01-03 MED ORDER — TRAMADOL HCL 50 MG PO TABS: 50.0000 mg | ORAL_TABLET | Freq: Four times a day (QID) | ORAL | 0 refills | Status: AC | PRN

## 2021-01-03 MED ORDER — LACTATED RINGERS IV SOLN: INTRAVENOUS | Status: DC

## 2021-01-03 MED ORDER — HYDROMORPHONE HCL 1 MG/ML IJ SOLN: 1.0000 mg | INTRAMUSCULAR | Status: DC | PRN

## 2021-01-03 MED ORDER — DEXAMETHASONE SODIUM PHOSPHATE 10 MG/ML IJ SOLN
INTRAMUSCULAR | Status: AC
  Filled 2021-01-03: qty 1

## 2021-01-03 MED ORDER — OXYCODONE HCL 5 MG PO TABS: 5.0000 mg | ORAL_TABLET | Freq: Once | ORAL | Status: DC | PRN

## 2021-01-03 MED ORDER — BUPIVACAINE-EPINEPHRINE 0.5% -1:200000 IJ SOLN
INTRAMUSCULAR | Status: DC | PRN
  Administered 2021-01-03: 20 mL

## 2021-01-03 MED ORDER — CHLORHEXIDINE GLUCONATE 0.12 % MT SOLN: 15.0000 mL | Freq: Once | OROMUCOSAL | Status: DC

## 2021-01-03 MED ORDER — ESMOLOL HCL 100 MG/10ML IV SOLN
INTRAVENOUS | Status: AC
  Filled 2021-01-03: qty 10

## 2021-01-03 MED ORDER — 0.9 % SODIUM CHLORIDE (POUR BTL) OPTIME
TOPICAL | Status: DC | PRN
  Administered 2021-01-03: 1000 mL

## 2021-01-03 MED ORDER — CEFAZOLIN SODIUM-DEXTROSE 2-4 GM/100ML-% IV SOLN
2.0000 g | INTRAVENOUS | Status: AC
  Administered 2021-01-03: 2 g via INTRAVENOUS

## 2021-01-03 MED ORDER — ROCURONIUM BROMIDE 10 MG/ML (PF) SYRINGE
PREFILLED_SYRINGE | INTRAVENOUS | Status: DC | PRN
  Administered 2021-01-03: 60 mg via INTRAVENOUS

## 2021-01-03 MED ORDER — ORAL CARE MOUTH RINSE: 15.0000 mL | Freq: Once | OROMUCOSAL | Status: DC

## 2021-01-03 MED ORDER — LIDOCAINE 2% (20 MG/ML) 5 ML SYRINGE
INTRAMUSCULAR | Status: AC
  Filled 2021-01-03: qty 5

## 2021-01-03 MED ORDER — PHENYLEPHRINE HCL-NACL 10-0.9 MG/250ML-% IV SOLN
INTRAVENOUS | Status: DC | PRN
  Administered 2021-01-03: 40 ug/min via INTRAVENOUS

## 2021-01-03 MED ORDER — ONDANSETRON HCL 4 MG/2ML IJ SOLN
INTRAMUSCULAR | Status: AC
  Filled 2021-01-03: qty 2

## 2021-01-03 MED ORDER — SODIUM CHLORIDE 0.45 % IV SOLN: INTRAVENOUS | Status: DC

## 2021-01-03 MED ORDER — ONDANSETRON HCL 4 MG/2ML IJ SOLN
INTRAMUSCULAR | Status: DC | PRN
  Administered 2021-01-03: 4 mg via INTRAVENOUS

## 2021-01-03 MED ORDER — ONDANSETRON 4 MG PO TBDP: 4.0000 mg | ORAL_TABLET | Freq: Four times a day (QID) | ORAL | Status: DC | PRN

## 2021-01-03 MED ORDER — OXYCODONE HCL 5 MG/5ML PO SOLN: 5.0000 mg | Freq: Once | ORAL | Status: DC | PRN

## 2021-01-03 MED ORDER — HYDROMORPHONE HCL 1 MG/ML IJ SOLN: 0.2500 mg | INTRAMUSCULAR | Status: DC | PRN

## 2021-01-03 MED ORDER — BUPIVACAINE-EPINEPHRINE 0.5% -1:200000 IJ SOLN
INTRAMUSCULAR | Status: AC
  Filled 2021-01-03: qty 1

## 2021-01-03 MED ORDER — MEPERIDINE HCL 50 MG/ML IJ SOLN: 6.2500 mg | INTRAMUSCULAR | Status: DC | PRN

## 2021-01-03 MED ORDER — ACETAMINOPHEN 650 MG RE SUPP: 650.0000 mg | Freq: Four times a day (QID) | RECTAL | Status: DC | PRN

## 2021-01-03 MED ORDER — CEFAZOLIN SODIUM-DEXTROSE 2-4 GM/100ML-% IV SOLN
INTRAVENOUS | Status: AC
  Filled 2021-01-03: qty 100

## 2021-01-03 MED ORDER — PROMETHAZINE HCL 25 MG/ML IJ SOLN: 6.2500 mg | INTRAMUSCULAR | Status: DC | PRN

## 2021-01-03 MED ORDER — FENTANYL CITRATE (PF) 250 MCG/5ML IJ SOLN
INTRAMUSCULAR | Status: DC | PRN
  Administered 2021-01-03: 100 ug via INTRAVENOUS
  Administered 2021-01-03 (×2): 50 ug via INTRAVENOUS

## 2021-01-03 MED ORDER — ROCURONIUM BROMIDE 10 MG/ML (PF) SYRINGE
PREFILLED_SYRINGE | INTRAVENOUS | Status: AC
  Filled 2021-01-03: qty 10

## 2021-01-03 MED ORDER — TRAMADOL HCL 50 MG PO TABS: 50.0000 mg | ORAL_TABLET | Freq: Four times a day (QID) | ORAL | Status: DC | PRN

## 2021-01-03 MED ORDER — SUGAMMADEX SODIUM 200 MG/2ML IV SOLN
INTRAVENOUS | Status: DC | PRN
  Administered 2021-01-03: 300 mg via INTRAVENOUS

## 2021-01-03 MED ORDER — METFORMIN HCL 500 MG PO TABS
500.0000 mg | ORAL_TABLET | Freq: Every day | ORAL | Status: DC
  Administered 2021-01-04: 500 mg via ORAL
  Filled 2021-01-03: qty 1

## 2021-01-03 MED ORDER — PROPOFOL 10 MG/ML IV BOLUS
INTRAVENOUS | Status: DC | PRN
  Administered 2021-01-03: 120 mg via INTRAVENOUS

## 2021-01-03 MED ORDER — PROPOFOL 10 MG/ML IV BOLUS
INTRAVENOUS | Status: AC
  Filled 2021-01-03: qty 20

## 2021-01-03 MED ORDER — OXYCODONE HCL 5 MG PO TABS: 5.0000 mg | ORAL_TABLET | ORAL | Status: DC | PRN

## 2021-01-03 MED ORDER — MIDAZOLAM HCL 2 MG/2ML IJ SOLN
0.5000 mg | Freq: Once | INTRAMUSCULAR | Status: DC | PRN
Start: 2021-01-03 — End: 2021-01-03

## 2021-01-03 MED ORDER — INSULIN ASPART 100 UNIT/ML ~~LOC~~ SOLN
0.0000 [IU] | Freq: Three times a day (TID) | SUBCUTANEOUS | Status: DC
  Administered 2021-01-03: 3 [IU] via SUBCUTANEOUS

## 2021-01-03 MED ORDER — DEXAMETHASONE SODIUM PHOSPHATE 10 MG/ML IJ SOLN
INTRAMUSCULAR | Status: DC | PRN
  Administered 2021-01-03: 4 mg via INTRAVENOUS

## 2021-01-03 SURGICAL SUPPLY — 34 items
APPLIER CLIP ROT 10 11.4 M/L (STAPLE) ×2
CABLE HIGH FREQUENCY MONO STRZ (ELECTRODE) ×2 IMPLANT
CHLORAPREP W/TINT 26 (MISCELLANEOUS) ×4 IMPLANT
CLIP APPLIE ROT 10 11.4 M/L (STAPLE) ×1 IMPLANT
COVER MAYO STAND STRL (DRAPES) ×2 IMPLANT
COVER SURGICAL LIGHT HANDLE (MISCELLANEOUS) ×2 IMPLANT
COVER WAND RF STERILE (DRAPES) IMPLANT
DECANTER SPIKE VIAL GLASS SM (MISCELLANEOUS) ×2 IMPLANT
DERMABOND ADVANCED (GAUZE/BANDAGES/DRESSINGS) ×1
DERMABOND ADVANCED .7 DNX12 (GAUZE/BANDAGES/DRESSINGS) ×1 IMPLANT
DRAPE C-ARM 42X120 X-RAY (DRAPES) ×2 IMPLANT
ELECT REM PT RETURN 15FT ADLT (MISCELLANEOUS) ×2 IMPLANT
GAUZE SPONGE 2X2 8PLY STRL LF (GAUZE/BANDAGES/DRESSINGS) ×1 IMPLANT
GLOVE SURG ORTHO LTX SZ8 (GLOVE) ×2 IMPLANT
GOWN STRL REUS W/TWL XL LVL3 (GOWN DISPOSABLE) ×4 IMPLANT
HEMOSTAT SURGICEL 4X8 (HEMOSTASIS) IMPLANT
KIT BASIN OR (CUSTOM PROCEDURE TRAY) ×2 IMPLANT
KIT TURNOVER KIT A (KITS) ×2 IMPLANT
PENCIL SMOKE EVACUATOR (MISCELLANEOUS) IMPLANT
POUCH SPECIMEN RETRIEVAL 10MM (ENDOMECHANICALS) ×2 IMPLANT
SCISSORS LAP 5X35 DISP (ENDOMECHANICALS) ×2 IMPLANT
SET CHOLANGIOGRAPH MIX (MISCELLANEOUS) ×2 IMPLANT
SET IRRIG TUBING LAPAROSCOPIC (IRRIGATION / IRRIGATOR) ×2 IMPLANT
SET TUBE SMOKE EVAC HIGH FLOW (TUBING) IMPLANT
SLEEVE XCEL OPT CAN 5 100 (ENDOMECHANICALS) ×2 IMPLANT
SPONGE GAUZE 2X2 STER 10/PKG (GAUZE/BANDAGES/DRESSINGS) ×1
STRIP CLOSURE SKIN 1/2X4 (GAUZE/BANDAGES/DRESSINGS) IMPLANT
SUT MNCRL AB 4-0 PS2 18 (SUTURE) ×2 IMPLANT
TOWEL OR 17X26 10 PK STRL BLUE (TOWEL DISPOSABLE) ×2 IMPLANT
TOWEL OR NON WOVEN STRL DISP B (DISPOSABLE) ×2 IMPLANT
TRAY LAPAROSCOPIC (CUSTOM PROCEDURE TRAY) ×2 IMPLANT
TROCAR BLADELESS OPT 5 100 (ENDOMECHANICALS) ×2 IMPLANT
TROCAR XCEL BLUNT TIP 100MML (ENDOMECHANICALS) ×2 IMPLANT
TROCAR XCEL NON-BLD 11X100MML (ENDOMECHANICALS) ×2 IMPLANT

## 2021-01-03 NOTE — Anesthesia Preprocedure Evaluation (Addendum)
Anesthesia Evaluation  Patient identified by MRN, date of birth, ID band Patient awake    Reviewed: Allergy & Precautions, NPO status , Patient's Chart, lab work & pertinent test results  History of Anesthesia Complications Negative for: history of anesthetic complications  Airway Mallampati: II  TM Distance: >3 FB Neck ROM: Full    Dental  (+) Dental Advisory Given   Pulmonary neg pulmonary ROS,  12/31/2020 SARS coronavirus NEG   breath sounds clear to auscultation       Cardiovascular negative cardio ROS   Rhythm:Regular Rate:Normal     Neuro/Psych negative neurological ROS     GI/Hepatic Neg liver ROS, cholelithiasis   Endo/Other  diabetes (glu 178), Oral Hypoglycemic Agents  Renal/GU negative Renal ROS     Musculoskeletal   Abdominal   Peds  Hematology negative hematology ROS (+)   Anesthesia Other Findings H/o breast cancer  Reproductive/Obstetrics                            Anesthesia Physical Anesthesia Plan  ASA: II  Anesthesia Plan: General   Post-op Pain Management:    Induction: Intravenous  PONV Risk Score and Plan: 3 and Ondansetron, Dexamethasone and Treatment may vary due to age or medical condition  Airway Management Planned: Oral ETT  Additional Equipment: None  Intra-op Plan:   Post-operative Plan: Extubation in OR  Informed Consent: I have reviewed the patients History and Physical, chart, labs and discussed the procedure including the risks, benefits and alternatives for the proposed anesthesia with the patient or authorized representative who has indicated his/her understanding and acceptance.     Dental advisory given  Plan Discussed with: CRNA and Surgeon  Anesthesia Plan Comments:        Anesthesia Quick Evaluation

## 2021-01-03 NOTE — Op Note (Addendum)
Procedure Note  Pre-operative Diagnosis:  Chronic cholecystitis, cholelithiasis  Post-operative Diagnosis:  same  Surgeon:  Armandina Gemma, MD  Assistant:  Mohammed Kindle, RN, BSN   Procedure:  Laparoscopic cholecystectomy with intra-operative cholangiography  Anesthesia:  General  Estimated Blood Loss:  50 cc  Drains: none         Specimen: gallbladder to pathology  Indications:  Patient is referred by her primary care physician, Dr. Yaakov Guthrie, for surgical management of cholelithiasis. Patient was found on an incidental radiographic study in November 2021 to have cholelithiasis. Patient has had minimal symptoms. She does have type 2 diabetes. Ultrasound examination in January 2022 showed multiple gallstones in the largest measuring 1.4 cm in diameter. There were no acute inflammatory changes. There is no biliary dilatation. There was mild increase in echogenicity of the liver consistent with fatty liver. Patient denies any history of jaundice or acholic stools. She has no prior history of hepatobiliary or pancreatic disease. She has had previous gynecologic surgery on the abdomen. Patient denies any fatty food intolerance. Her primary care physician recommended cholecystectomy due to her age, medical conditions, and diabetes. Patient presents today for cholecystectomy.  Procedure Details:  The patient was seen in the pre-op holding area. The risks, benefits, complications, treatment options, and expected outcomes were previously discussed with the patient. The patient agreed with the proposed plan and has signed the informed consent form.  The patient was transported to operating room #5 at the [facility]. The patient was placed in the supine position on the operating room table. Following induction of general anesthesia, the abdomen was prepped and draped in the usual aseptic fashion.  An incision was made in the skin near the umbilicus. The midline fascia was incised  and the peritoneal cavity was entered and a Hasson cannula was introduced under direct vision. The cannula was secured with a 0-Vicryl pursestring suture. Pneumoperitoneum was established with carbon dioxide. Additional cannulae were introduced under direct vision along the right costal margin in the midline, mid-clavicular line, and anterior axillary line.   The gallbladder was identified and the fundus grasped and retracted cephalad. Adhesions were taken down bluntly and the electrocautery was utilized as needed, taking care not to involve any adjacent structures. The infundibulum was grasped and retracted laterally, exposing the peritoneum overlying the triangle of Calot. The peritoneum was incised and structures exposed with blunt dissection. The cystic duct was clearly identified, bluntly dissected circumferentially, and clipped at the neck of the gallbladder.  An incision was made in the cystic duct and the cholangiogram catheter introduced. The catheter was secured using an ligaclip.  Real-time cholangiography was performed using C-arm fluoroscopy.  There was rapid filling of a normal caliber common bile duct.  There was reflux of contrast into the left and right hepatic ductal systems.  There was free flow distally into the duodenum without filling defect or obstruction.  The catheter was removed from the peritoneal cavity.  The cystic duct was then ligated with ligaclips and divided. The cystic artery was identified, dissected circumferentially, ligated with ligaclips, and divided.  The gallbladder was dissected away from the gallbladder bed using the electrocautery for hemostasis. The gallbladder was completely removed from the liver and placed into an endocatch bag. The gallbladder was removed in the endocatch bag through the umbilical port site and submitted to pathology for review.  The right upper quadrant was irrigated and the gallbladder bed was inspected. Hemostasis was achieved with the  electrocautery.  Cannulae were removed  under direct vision and good hemostasis was noted. Pneumoperitoneum was released and the majority of the carbon dioxide evacuated. The umbilical wound was irrigated and the fascia was then closed with the pursestring suture.  Local anesthetic was infiltrated at all port sites. Skin incisions were closed with 4-0 Monocril subcuticular sutures and Dermabond was applied.  Instrument, sponge, and needle counts were correct at the conclusion of the case.  The patient was awakened from anesthesia and brought to the recovery room in stable condition.  The patient tolerated the procedure well.   Armandina Gemma, MD [facility] Office: [phone]

## 2021-01-03 NOTE — Interval H&P Note (Signed)
History and Physical Interval Note:  01/03/2021 9:10 AM  Nancy Alvarado  has presented today for surgery, with the diagnosis of CHOLELITHIASIS.  The various methods of treatment have been discussed with the patient and family. After consideration of risks, benefits and other options for treatment, the patient has consented to    Procedure(s) with comments: LAPAROSCOPIC CHOLECYSTECTOMY WITH INTRAOPERATIVE CHOLANGIOGRAM (N/A) - 90 MIN as a surgical intervention.    The patient's history has been reviewed, patient examined, no change in status, stable for surgery.  I have reviewed the patient's chart and labs.  Questions were answered to the patient's satisfaction.    Armandina Gemma, MD [facility] Office: [place]

## 2021-01-03 NOTE — Anesthesia Procedure Notes (Signed)
Procedure Name: Intubation Date/Time: 01/03/2021 9:58 AM Performed by: Mitzie Na, CRNA Pre-anesthesia Checklist: Patient identified, Emergency Drugs available, Suction available and Patient being monitored Patient Re-evaluated:Patient Re-evaluated prior to induction Oxygen Delivery Method: Circle system utilized Preoxygenation: Pre-oxygenation with 100% oxygen Induction Type: IV induction Ventilation: Mask ventilation without difficulty and Oral airway inserted - appropriate to patient size Laryngoscope Size: Mac and 3 Grade View: Grade I Tube type: Oral Tube size: 7.0 mm Number of attempts: 1 Airway Equipment and Method: Stylet and Oral airway Placement Confirmation: ETT inserted through vocal cords under direct vision,  positive ETCO2 and breath sounds checked- equal and bilateral Secured at: 23 cm Tube secured with: Tape Dental Injury: Teeth and Oropharynx as per pre-operative assessment

## 2021-01-03 NOTE — Transfer of Care (Signed)
Immediate Anesthesia Transfer of Care Note  Patient: Nancy Alvarado  Procedure(s) Performed: LAPAROSCOPIC CHOLECYSTECTOMY WITH INTRAOPERATIVE CHOLANGIOGRAM (N/A )  Patient Location: PACU  Anesthesia Type:General  Level of Consciousness: awake, alert , oriented and patient cooperative  Airway & Oxygen Therapy: Patient Spontanous Breathing and Patient connected to face mask oxygen  Post-op Assessment: Report given to RN, Post -op Vital signs reviewed and stable and Patient moving all extremities  Post vital signs: Reviewed and stable  Last Vitals:  Vitals Value Taken Time  BP 148/71 01/03/21 1134  Temp 36.4 C 01/03/21 1134  Pulse 88 01/03/21 1136  Resp 22 01/03/21 1136  SpO2 96 % 01/03/21 1136  Vitals shown include unvalidated device data.  Last Pain:  Vitals:   01/03/21 1134  TempSrc:   PainSc: 0-No pain         Complications: No complications documented.

## 2021-01-03 NOTE — Discharge Instructions (Signed)
[facility]  LAPAROSCOPIC SURGERY:  POST-OP INSTRUCTIONS  Always review your discharge instruction sheet given to you by the facility where your surgery was performed.  A prescription for pain medication may be given to you upon discharge.  Take your pain medication as prescribed.  If narcotic pain medicine is not needed, then you may take acetaminophen (Tylenol) or ibuprofen (Advil) as needed.  Take your usually prescribed medications unless otherwise directed.  If you need a refill on your pain medication, please contact your pharmacy.  They will contact our office to request authorization. Prescriptions will not be filled after 5 P.M. or on weekends.  You should follow a light diet the first few days after arrival home, such as soup and crackers or toast.  Be sure to include plenty of fluids daily.  Most patients will experience some swelling and bruising in the area of the incisions.  Ice packs will help.  Swelling and bruising can take several days to resolve.   It is common to experience some constipation after surgery.  Increasing fluid intake and taking a stool softener (such as Colace) will usually help or prevent this problem from occurring.  A mild laxative (Milk of Magnesia or Miralax) should be taken according to package instructions if there has been no bowel movement after 48 hours.  You will likely have Dermabond (topical glue) over your incisions.  This seals the incisions and allows you to bathe and shower at any time after your surgery.  Glue should remain in place for up to 10 days.  It may be removed after 10 days by pealing off the Dermabond material or using Vaseline or naval jelly to remove.  If you have steri-strips over your incisions, you may remove the gauze bandage on the second day after surgery, and you may shower at that time.  Leave your steri-strips (small skin tapes) in place directly over the incision.  These strips should remain on the  skin for 5-7 days and then be removed.  You may get them wet in the shower and pat them dry.  Any sutures or staples will be removed at the office during your follow-up visit.  ACTIVITIES:  You may resume regular (light) daily activities beginning the next day - such as daily self-care, walking, climbing stairs - gradually increasing activities as tolerated.  You may have sexual intercourse when it is comfortable.  Refrain from any heavy lifting or straining until approved by your doctor.  You may drive when you are no longer taking prescription pain medication, when you can comfortably wear a seatbelt, and when you can safely maneuver your car and apply brakes.  You should see your doctor in the office for a follow-up appointment approximately 2-3 weeks after your surgery.  Make sure that you call for this appointment within a day or two after you arrive home to insure a convenient appointment time.  WHEN TO CALL YOUR DOCTOR: 1. Fever over 101.0 2. Inability to urinate 3. Continued bleeding from incision 4. Increased pain, redness, or drainage from the incision 5. Increasing abdominal pain  The clinic staff is available to answer your questions during regular business hours.  Please don't hesitate to call and ask to speak to one of the nurses for clinical concerns.  If you have a medical emergency, go to the nearest emergency room or call 911.  A surgeon from [facility] is always on call for the hospital.  Armandina Gemma, [facility] Office: [phone] Toll Free:  [place] [phone]  Website: [email or web address]

## 2021-01-03 NOTE — Anesthesia Postprocedure Evaluation (Signed)
Anesthesia Post Note  Patient: Nancy Alvarado  Procedure(s) Performed: LAPAROSCOPIC CHOLECYSTECTOMY WITH INTRAOPERATIVE CHOLANGIOGRAM (N/A )     Patient location during evaluation: PACU Anesthesia Type: General Level of consciousness: awake and alert, patient cooperative and oriented Pain management: pain level controlled Vital Signs Assessment: post-procedure vital signs reviewed and stable Respiratory status: spontaneous breathing, nonlabored ventilation and respiratory function stable Cardiovascular status: blood pressure returned to baseline and stable Postop Assessment: no apparent nausea or vomiting Anesthetic complications: no   No complications documented.  Last Vitals:  Vitals:   01/03/21 1215 01/03/21 1230  BP: (!) 147/79 124/80  Pulse: (!) 104 (!) 106  Resp: (!) 31 (!) 28  Temp:    SpO2: 95% 93%    Last Pain:  Vitals:   01/03/21 1145  TempSrc:   PainSc: 0-No pain                 Mayla Biddy,E. Kaely Hollan

## 2021-01-04 ENCOUNTER — Encounter (HOSPITAL_COMMUNITY): Payer: Self-pay | Admitting: Surgery

## 2021-01-04 DIAGNOSIS — K8010 Calculus of gallbladder with chronic cholecystitis without obstruction: Secondary | ICD-10-CM | POA: Diagnosis not present

## 2021-01-04 DIAGNOSIS — E119 Type 2 diabetes mellitus without complications: Secondary | ICD-10-CM | POA: Diagnosis not present

## 2021-01-04 DIAGNOSIS — Z853 Personal history of malignant neoplasm of breast: Secondary | ICD-10-CM | POA: Diagnosis not present

## 2021-01-04 LAB — SURGICAL PATHOLOGY

## 2021-01-04 LAB — GLUCOSE, CAPILLARY: Glucose-Capillary: 115 mg/dL — ABNORMAL HIGH (ref 70–99)

## 2021-01-04 NOTE — Progress Notes (Signed)
Reviewed d/c instructions w pt and husband and they verbalize understanding. All questions answered. Awaiting ride home.

## 2021-01-04 NOTE — Progress Notes (Signed)
1 Day Post-Op   Subjective/Chief Complaint: No complaints. Tolerating diet   Objective: Vital signs in last 24 hours: Temp:  [97.6 F (36.4 C)-99.4 F (37.4 C)] 98.2 F (36.8 C) (04/15 0439) Pulse Rate:  [86-107] 93 (04/15 0439) Resp:  [15-31] 15 (04/15 0439) BP: (124-158)/(67-86) 134/77 (04/15 0439) SpO2:  [92 %-99 %] 99 % (04/15 0439) Last BM Date: 01/02/21  Intake/Output from previous day: 04/14 0701 - 04/15 0700 In: 1970.8 [P.O.:180; I.V.:1690.8; IV Piggyback:100] Out: 375 [Urine:350; Blood:25] Intake/Output this shift: No intake/output data recorded.  General appearance: alert and cooperative Resp: clear to auscultation bilaterally Cardio: regular rate and rhythm GI: soft, minimal tenderness  Lab Results:  No results for input(s): WBC, HGB, HCT, PLT in the last 72 hours. BMET No results for input(s): NA, K, CL, CO2, GLUCOSE, BUN, CREATININE, CALCIUM in the last 72 hours. PT/INR No results for input(s): LABPROT, INR in the last 72 hours. ABG No results for input(s): PHART, HCO3 in the last 72 hours.  Invalid input(s): PCO2, PO2  Studies/Results: DG Cholangiogram Operative  Result Date: 01/03/2021 CLINICAL DATA:  Cholecystectomy for cholelithiasis. EXAM: INTRAOPERATIVE CHOLANGIOGRAM TECHNIQUE: Cholangiographic images from the C-arm fluoroscopic device were submitted for interpretation post-operatively. Please see the procedural report for the amount of contrast and the fluoroscopy time utilized. COMPARISON:  Right upper quadrant abdominal ultrasound on 10/02/2020 FINDINGS: Intraoperative cholangiogram with a C-arm demonstrates no evidence of biliary filling defect or obstruction. Contrast enters the duodenum normally. Upper common duct and common hepatic duct are mildly prominent in caliber but felt to be still within normal limits. No contrast extravasation identified. IMPRESSION: Unremarkable intraoperative cholangiogram. Electronically Signed   By: Aletta Edouard  M.D.   On: 01/03/2021 11:10    Anti-infectives: Anti-infectives (From admission, onward)   Start     Dose/Rate Route Frequency Ordered Stop   01/03/21 0815  ceFAZolin (ANCEF) IVPB 2g/100 mL premix        2 g 200 mL/hr over 30 Minutes Intravenous On call to O.R. 01/03/21 0804 01/03/21 0959   01/03/21 0807  ceFAZolin (ANCEF) 2-4 GM/100ML-% IVPB       Note to Pharmacy: Randa Evens  : cabinet override      01/03/21 0807 01/03/21 1007      Assessment/Plan: s/p Procedure(s) with comments: LAPAROSCOPIC CHOLECYSTECTOMY WITH INTRAOPERATIVE CHOLANGIOGRAM (N/A) - 90 MIN Advance diet Discharge  LOS: 0 days    Nancy Alvarado 01/04/2021

## 2021-01-04 NOTE — Plan of Care (Signed)
  Problem: Education: Goal: Knowledge of General Education information will improve Description: Including pain rating scale, medication(s)/side effects and non-pharmacologic comfort measures Outcome: Progressing   Problem: Clinical Measurements: Goal: Ability to maintain clinical measurements within normal limits will improve Outcome: Progressing   Problem: Activity: Goal: Risk for activity intolerance will decrease Outcome: Progressing   Problem: Elimination: Goal: Will not experience complications related to bowel motility Outcome: Progressing   Problem: Pain Managment: Goal: General experience of comfort will improve Outcome: Progressing   Problem: Skin Integrity: Goal: Risk for impaired skin integrity will decrease Outcome: Progressing   Problem: Safety: Goal: Ability to remain free from injury will improve Outcome: Progressing

## 2021-01-04 NOTE — Progress Notes (Signed)
D/c per w/c w all belongings in stable condition.

## 2021-01-10 DIAGNOSIS — D0512 Intraductal carcinoma in situ of left breast: Secondary | ICD-10-CM | POA: Diagnosis not present

## 2021-01-10 DIAGNOSIS — D0511 Intraductal carcinoma in situ of right breast: Secondary | ICD-10-CM | POA: Diagnosis not present

## 2021-01-10 DIAGNOSIS — D509 Iron deficiency anemia, unspecified: Secondary | ICD-10-CM | POA: Diagnosis not present

## 2021-01-10 DIAGNOSIS — E7800 Pure hypercholesterolemia, unspecified: Secondary | ICD-10-CM | POA: Diagnosis not present

## 2021-01-10 DIAGNOSIS — E1169 Type 2 diabetes mellitus with other specified complication: Secondary | ICD-10-CM | POA: Diagnosis not present

## 2021-02-12 DIAGNOSIS — D0512 Intraductal carcinoma in situ of left breast: Secondary | ICD-10-CM | POA: Diagnosis not present

## 2021-02-12 DIAGNOSIS — D509 Iron deficiency anemia, unspecified: Secondary | ICD-10-CM | POA: Diagnosis not present

## 2021-02-12 DIAGNOSIS — E1169 Type 2 diabetes mellitus with other specified complication: Secondary | ICD-10-CM | POA: Diagnosis not present

## 2021-02-12 DIAGNOSIS — E7800 Pure hypercholesterolemia, unspecified: Secondary | ICD-10-CM | POA: Diagnosis not present

## 2021-02-12 DIAGNOSIS — D0511 Intraductal carcinoma in situ of right breast: Secondary | ICD-10-CM | POA: Diagnosis not present

## 2021-02-20 ENCOUNTER — Telehealth: Payer: Self-pay | Admitting: Hematology and Oncology

## 2021-02-20 NOTE — Telephone Encounter (Signed)
R/s per prov pal, per 1/27 los ,, left message

## 2021-03-12 DIAGNOSIS — D509 Iron deficiency anemia, unspecified: Secondary | ICD-10-CM | POA: Diagnosis not present

## 2021-03-12 DIAGNOSIS — E7800 Pure hypercholesterolemia, unspecified: Secondary | ICD-10-CM | POA: Diagnosis not present

## 2021-03-12 DIAGNOSIS — D0511 Intraductal carcinoma in situ of right breast: Secondary | ICD-10-CM | POA: Diagnosis not present

## 2021-03-12 DIAGNOSIS — D0512 Intraductal carcinoma in situ of left breast: Secondary | ICD-10-CM | POA: Diagnosis not present

## 2021-03-12 DIAGNOSIS — E1169 Type 2 diabetes mellitus with other specified complication: Secondary | ICD-10-CM | POA: Diagnosis not present

## 2021-03-13 ENCOUNTER — Ambulatory Visit
Admission: RE | Admit: 2021-03-13 | Discharge: 2021-03-13 | Disposition: A | Discharge status: Home / Self Care | Payer: Medicare Other | Source: Ambulatory Visit | Attending: Adult Health | Admitting: Adult Health

## 2021-03-13 ENCOUNTER — Other Ambulatory Visit: Payer: Self-pay

## 2021-03-13 DIAGNOSIS — Z780 Asymptomatic menopausal state: Secondary | ICD-10-CM | POA: Diagnosis not present

## 2021-03-13 DIAGNOSIS — M8588 Other specified disorders of bone density and structure, other site: Secondary | ICD-10-CM | POA: Diagnosis not present

## 2021-03-13 DIAGNOSIS — E2839 Other primary ovarian failure: Secondary | ICD-10-CM

## 2021-03-13 DIAGNOSIS — Z853 Personal history of malignant neoplasm of breast: Secondary | ICD-10-CM | POA: Diagnosis not present

## 2021-03-13 DIAGNOSIS — D0512 Intraductal carcinoma in situ of left breast: Secondary | ICD-10-CM

## 2021-03-13 DIAGNOSIS — R922 Inconclusive mammogram: Secondary | ICD-10-CM | POA: Diagnosis not present

## 2021-03-14 ENCOUNTER — Telehealth: Payer: Self-pay

## 2021-03-14 NOTE — Telephone Encounter (Signed)
Patient made aware of results and recommendations per Wilber Bihari, NP.  No further questions or concerns at this time.  Patient knows to call clinic with any questions, concerns and problems.

## 2021-03-14 NOTE — Telephone Encounter (Signed)
-----   Message from Gardenia Phlegm, NP sent at 03/14/2021  7:43 AM EDT ----- Bone density shows mild osteopenia.  Please review calcium, vitamin d and weight bearing exercises.  Would repeat bone density in two years.  [place] ----- Message ----- From: Interface, Rad Results In Sent: 03/13/2021   5:05 PM EDT To: Gardenia Phlegm, NP

## 2021-03-15 NOTE — Telephone Encounter (Signed)
Patient made aware of results and recommendations per Wilber Bihari, NP.  No further questions or concerns at this time.  Patient knows to call clinic with any questions, concerns and problems.

## 2021-03-26 DIAGNOSIS — D509 Iron deficiency anemia, unspecified: Secondary | ICD-10-CM | POA: Diagnosis not present

## 2021-03-26 DIAGNOSIS — E1169 Type 2 diabetes mellitus with other specified complication: Secondary | ICD-10-CM | POA: Diagnosis not present

## 2021-03-26 DIAGNOSIS — E7800 Pure hypercholesterolemia, unspecified: Secondary | ICD-10-CM | POA: Diagnosis not present

## 2021-03-26 DIAGNOSIS — L723 Sebaceous cyst: Secondary | ICD-10-CM | POA: Diagnosis not present

## 2021-03-26 DIAGNOSIS — D0512 Intraductal carcinoma in situ of left breast: Secondary | ICD-10-CM | POA: Diagnosis not present

## 2021-03-26 DIAGNOSIS — L089 Local infection of the skin and subcutaneous tissue, unspecified: Secondary | ICD-10-CM | POA: Diagnosis not present

## 2021-03-26 DIAGNOSIS — D0511 Intraductal carcinoma in situ of right breast: Secondary | ICD-10-CM | POA: Diagnosis not present

## 2021-03-28 DIAGNOSIS — L723 Sebaceous cyst: Secondary | ICD-10-CM | POA: Diagnosis not present

## 2021-04-01 DIAGNOSIS — L089 Local infection of the skin and subcutaneous tissue, unspecified: Secondary | ICD-10-CM | POA: Diagnosis not present

## 2021-04-01 DIAGNOSIS — Z9889 Other specified postprocedural states: Secondary | ICD-10-CM | POA: Diagnosis not present

## 2021-04-01 DIAGNOSIS — Z4800 Encounter for change or removal of nonsurgical wound dressing: Secondary | ICD-10-CM | POA: Diagnosis not present

## 2021-04-05 DIAGNOSIS — Z9889 Other specified postprocedural states: Secondary | ICD-10-CM | POA: Diagnosis not present

## 2021-04-05 DIAGNOSIS — L089 Local infection of the skin and subcutaneous tissue, unspecified: Secondary | ICD-10-CM | POA: Diagnosis not present

## 2021-04-09 ENCOUNTER — Inpatient Hospital Stay: Payer: Medicare Other | Attending: Hematology and Oncology | Admitting: Hematology and Oncology

## 2021-04-09 NOTE — Assessment & Plan Note (Deleted)
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  Anastrozole toxicities:  Breast cancer surveillance 1.  Breast exam 04/09/2021: Benign 2. Mammogram 03/13/2021: Benign breast density category B  Return to clinic in 1 year for follow-up

## 2021-04-17 ENCOUNTER — Ambulatory Visit: Payer: Medicare Other | Admitting: Hematology and Oncology

## 2021-05-13 ENCOUNTER — Other Ambulatory Visit: Payer: Self-pay | Admitting: Hematology and Oncology

## 2021-05-20 DIAGNOSIS — E7800 Pure hypercholesterolemia, unspecified: Secondary | ICD-10-CM | POA: Diagnosis not present

## 2021-05-20 DIAGNOSIS — E1169 Type 2 diabetes mellitus with other specified complication: Secondary | ICD-10-CM | POA: Diagnosis not present

## 2021-05-20 DIAGNOSIS — D0512 Intraductal carcinoma in situ of left breast: Secondary | ICD-10-CM | POA: Diagnosis not present

## 2021-05-20 DIAGNOSIS — D0511 Intraductal carcinoma in situ of right breast: Secondary | ICD-10-CM | POA: Diagnosis not present

## 2021-05-20 DIAGNOSIS — D509 Iron deficiency anemia, unspecified: Secondary | ICD-10-CM | POA: Diagnosis not present

## 2021-05-21 IMAGING — RF DG CHOLANGIOGRAM OPERATIVE
1 series · 4 of 4 positions shown · non-contrast
Comparison: Right upper quadrant abdominal ultrasound on 10/02/2020

CLINICAL DATA: Cholecystectomy for cholelithiasis.

EXAM:
INTRAOPERATIVE CHOLANGIOGRAM
TECHNIQUE: Cholangiographic images from the C-arm fluoroscopic device were
submitted for interpretation post-operatively. Please see the
procedural report for the amount of contrast and the fluoroscopy
time utilized.

[Series 1: run · 4 of 84 frames shown]
[frame 13/84]
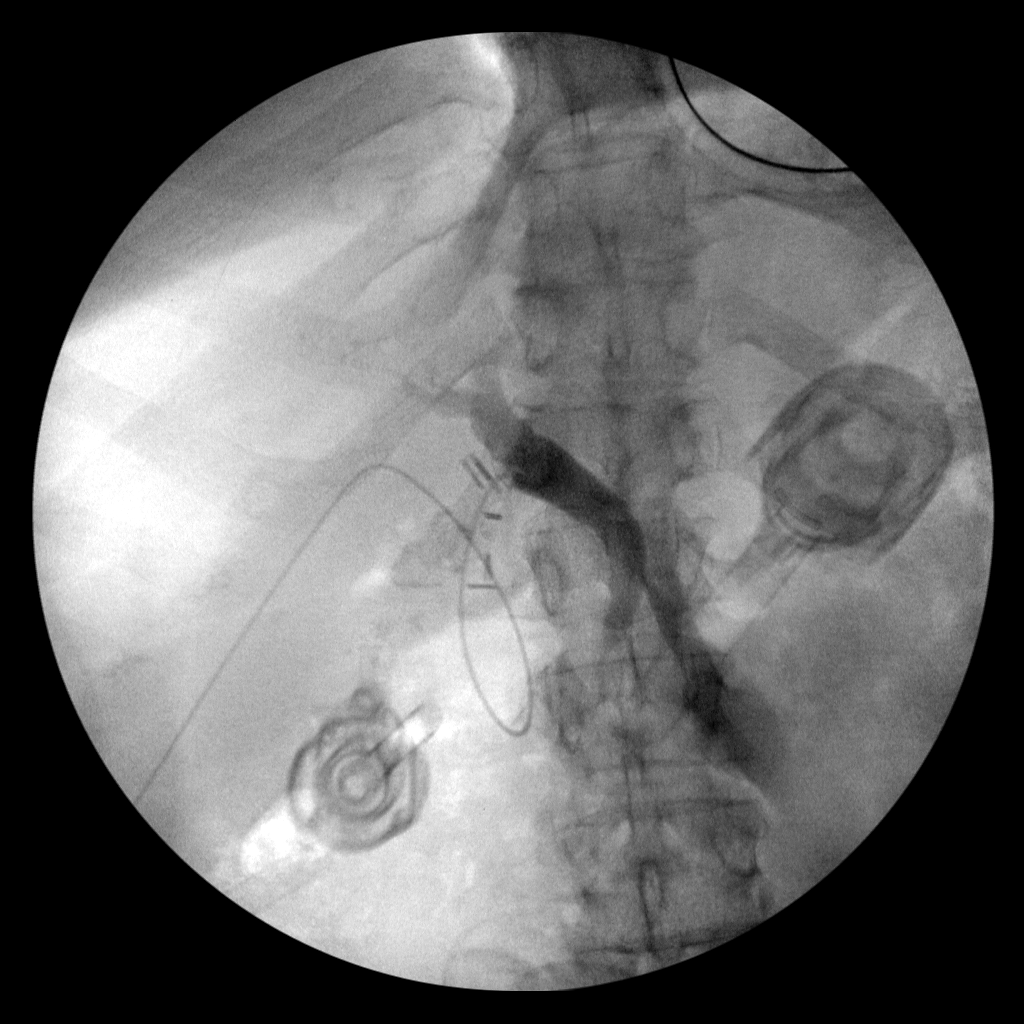
[frame 22/84]
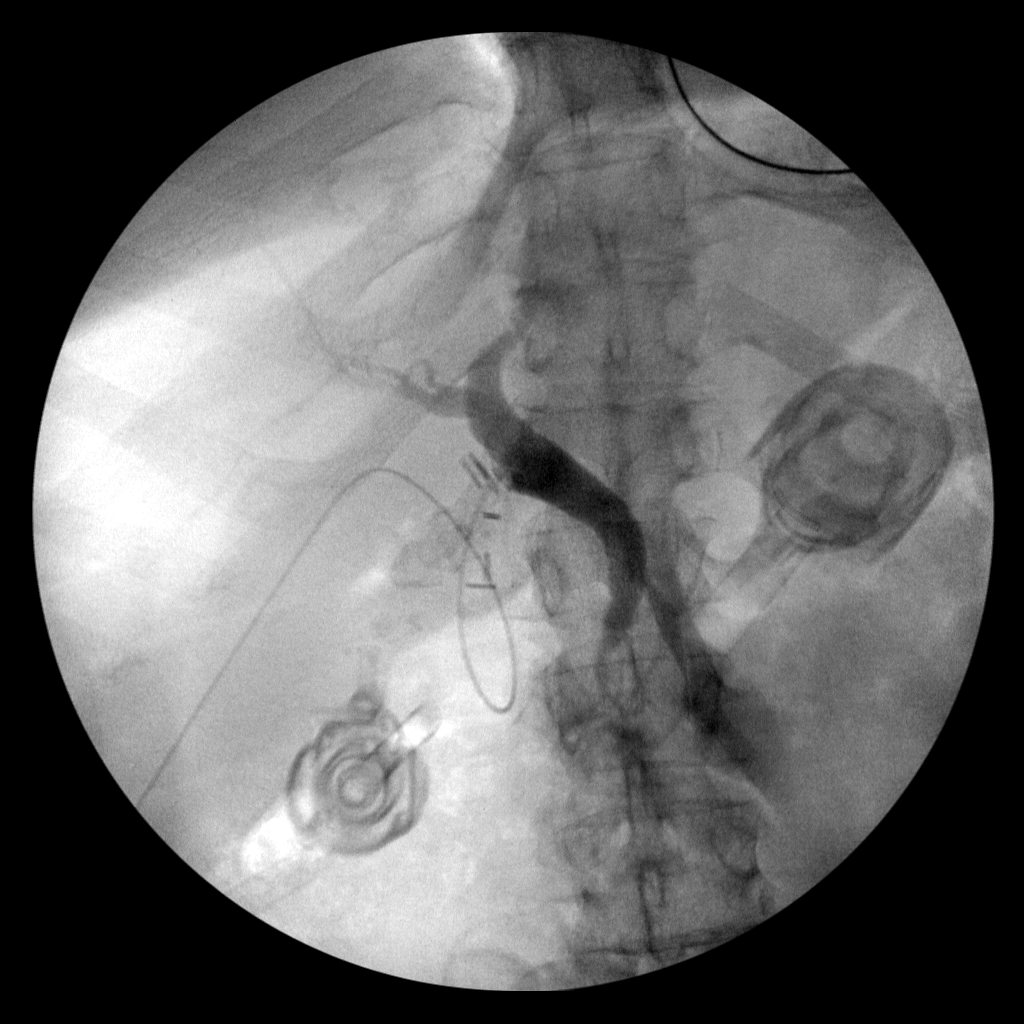
[frame 43/84]
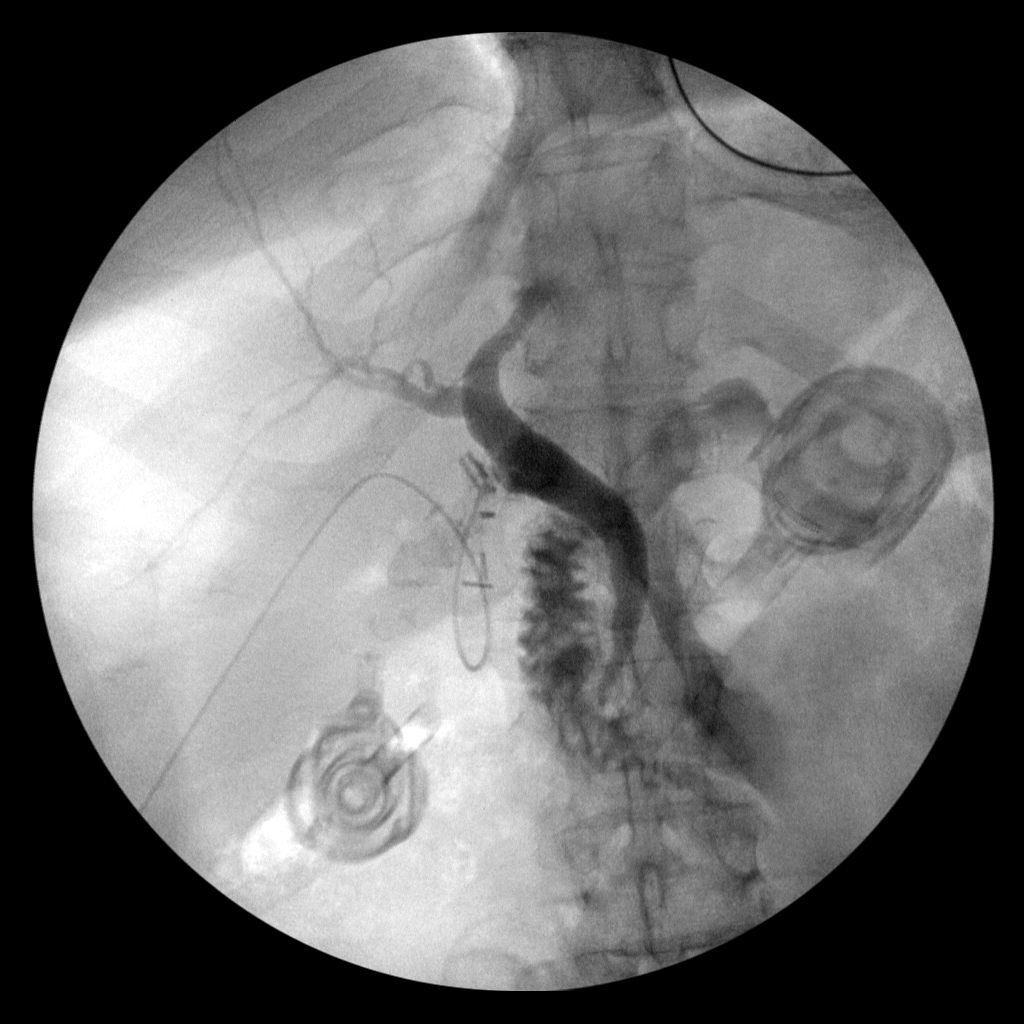
[frame 72/84]
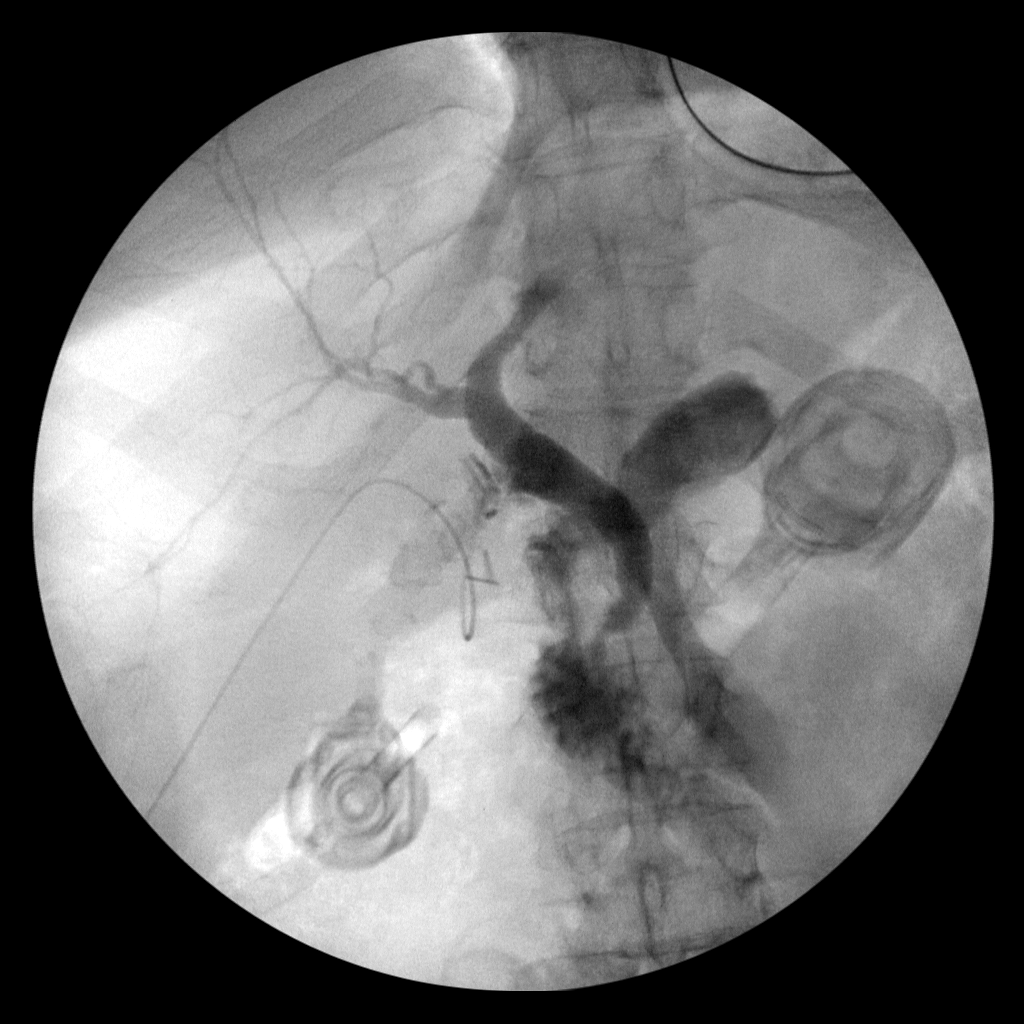

[4 of 4 positions shown; findings below may reference images not displayed]

FINDINGS: Intraoperative cholangiogram with a C-arm demonstrates no evidence
of biliary filling defect or obstruction. Contrast enters the
duodenum normally. Upper common duct and common hepatic duct are
mildly prominent in caliber but felt to be still within normal
limits. No contrast extravasation identified.
IMPRESSION: Unremarkable intraoperative cholangiogram.

## 2021-06-27 DIAGNOSIS — Z20822 Contact with and (suspected) exposure to covid-19: Secondary | ICD-10-CM | POA: Diagnosis not present

## 2021-06-27 DIAGNOSIS — Z7189 Other specified counseling: Secondary | ICD-10-CM | POA: Diagnosis not present

## 2021-06-27 DIAGNOSIS — Z1159 Encounter for screening for other viral diseases: Secondary | ICD-10-CM | POA: Diagnosis not present

## 2021-06-27 DIAGNOSIS — Z03818 Encounter for observation for suspected exposure to other biological agents ruled out: Secondary | ICD-10-CM | POA: Diagnosis not present

## 2021-07-21 DIAGNOSIS — E1169 Type 2 diabetes mellitus with other specified complication: Secondary | ICD-10-CM | POA: Diagnosis not present

## 2021-07-21 DIAGNOSIS — D509 Iron deficiency anemia, unspecified: Secondary | ICD-10-CM | POA: Diagnosis not present

## 2021-07-21 DIAGNOSIS — E7800 Pure hypercholesterolemia, unspecified: Secondary | ICD-10-CM | POA: Diagnosis not present

## 2021-09-30 DIAGNOSIS — E1169 Type 2 diabetes mellitus with other specified complication: Secondary | ICD-10-CM | POA: Diagnosis not present

## 2021-09-30 DIAGNOSIS — D509 Iron deficiency anemia, unspecified: Secondary | ICD-10-CM | POA: Diagnosis not present

## 2021-09-30 DIAGNOSIS — E7800 Pure hypercholesterolemia, unspecified: Secondary | ICD-10-CM | POA: Diagnosis not present

## 2021-10-17 ENCOUNTER — Encounter (HOSPITAL_COMMUNITY): Payer: Self-pay

## 2021-10-29 DIAGNOSIS — D509 Iron deficiency anemia, unspecified: Secondary | ICD-10-CM | POA: Diagnosis not present

## 2021-10-29 DIAGNOSIS — M25461 Effusion, right knee: Secondary | ICD-10-CM | POA: Diagnosis not present

## 2021-10-29 DIAGNOSIS — E7800 Pure hypercholesterolemia, unspecified: Secondary | ICD-10-CM | POA: Diagnosis not present

## 2021-10-29 DIAGNOSIS — E1169 Type 2 diabetes mellitus with other specified complication: Secondary | ICD-10-CM | POA: Diagnosis not present

## 2021-10-31 DIAGNOSIS — M25561 Pain in right knee: Secondary | ICD-10-CM | POA: Diagnosis not present

## 2021-11-13 DIAGNOSIS — E7800 Pure hypercholesterolemia, unspecified: Secondary | ICD-10-CM | POA: Diagnosis not present

## 2021-11-13 DIAGNOSIS — E1169 Type 2 diabetes mellitus with other specified complication: Secondary | ICD-10-CM | POA: Diagnosis not present

## 2021-12-17 DIAGNOSIS — E7800 Pure hypercholesterolemia, unspecified: Secondary | ICD-10-CM | POA: Diagnosis not present

## 2021-12-17 DIAGNOSIS — E1169 Type 2 diabetes mellitus with other specified complication: Secondary | ICD-10-CM | POA: Diagnosis not present

## 2022-01-10 DIAGNOSIS — D509 Iron deficiency anemia, unspecified: Secondary | ICD-10-CM | POA: Diagnosis not present

## 2022-01-10 DIAGNOSIS — R195 Other fecal abnormalities: Secondary | ICD-10-CM | POA: Diagnosis not present

## 2022-01-10 DIAGNOSIS — E1169 Type 2 diabetes mellitus with other specified complication: Secondary | ICD-10-CM | POA: Diagnosis not present

## 2022-01-10 DIAGNOSIS — E7800 Pure hypercholesterolemia, unspecified: Secondary | ICD-10-CM | POA: Diagnosis not present

## 2022-01-10 DIAGNOSIS — M25461 Effusion, right knee: Secondary | ICD-10-CM | POA: Diagnosis not present

## 2022-02-10 ENCOUNTER — Other Ambulatory Visit: Payer: Self-pay | Admitting: Adult Health

## 2022-02-10 DIAGNOSIS — Z1231 Encounter for screening mammogram for malignant neoplasm of breast: Secondary | ICD-10-CM

## 2022-03-04 DIAGNOSIS — Z791 Long term (current) use of non-steroidal anti-inflammatories (NSAID): Secondary | ICD-10-CM | POA: Diagnosis not present

## 2022-03-04 DIAGNOSIS — R195 Other fecal abnormalities: Secondary | ICD-10-CM | POA: Diagnosis not present

## 2022-03-04 DIAGNOSIS — E611 Iron deficiency: Secondary | ICD-10-CM | POA: Diagnosis not present

## 2022-03-17 ENCOUNTER — Other Ambulatory Visit: Payer: Self-pay | Admitting: Adult Health

## 2022-03-17 DIAGNOSIS — Z853 Personal history of malignant neoplasm of breast: Secondary | ICD-10-CM

## 2022-03-20 ENCOUNTER — Ambulatory Visit
Admission: RE | Admit: 2022-03-20 | Discharge: 2022-03-20 | Disposition: A | Discharge status: Home / Self Care | Payer: Medicare Other | Source: Ambulatory Visit | Attending: Adult Health | Admitting: Adult Health

## 2022-03-20 DIAGNOSIS — Z853 Personal history of malignant neoplasm of breast: Secondary | ICD-10-CM

## 2022-03-20 DIAGNOSIS — R928 Other abnormal and inconclusive findings on diagnostic imaging of breast: Secondary | ICD-10-CM | POA: Diagnosis not present

## 2022-04-15 DIAGNOSIS — E1169 Type 2 diabetes mellitus with other specified complication: Secondary | ICD-10-CM | POA: Diagnosis not present

## 2022-04-15 DIAGNOSIS — D509 Iron deficiency anemia, unspecified: Secondary | ICD-10-CM | POA: Diagnosis not present

## 2022-04-15 DIAGNOSIS — E7800 Pure hypercholesterolemia, unspecified: Secondary | ICD-10-CM | POA: Diagnosis not present

## 2022-04-17 DIAGNOSIS — K2930 Chronic superficial gastritis without bleeding: Secondary | ICD-10-CM | POA: Diagnosis not present

## 2022-04-17 DIAGNOSIS — D509 Iron deficiency anemia, unspecified: Secondary | ICD-10-CM | POA: Diagnosis not present

## 2022-04-17 DIAGNOSIS — K648 Other hemorrhoids: Secondary | ICD-10-CM | POA: Diagnosis not present

## 2022-04-17 DIAGNOSIS — K3189 Other diseases of stomach and duodenum: Secondary | ICD-10-CM | POA: Diagnosis not present

## 2022-04-17 DIAGNOSIS — B9681 Helicobacter pylori [H. pylori] as the cause of diseases classified elsewhere: Secondary | ICD-10-CM | POA: Diagnosis not present

## 2022-04-17 DIAGNOSIS — R195 Other fecal abnormalities: Secondary | ICD-10-CM | POA: Diagnosis not present

## 2022-04-17 DIAGNOSIS — K5730 Diverticulosis of large intestine without perforation or abscess without bleeding: Secondary | ICD-10-CM | POA: Diagnosis not present

## 2022-04-17 DIAGNOSIS — D120 Benign neoplasm of cecum: Secondary | ICD-10-CM | POA: Diagnosis not present

## 2022-07-06 ENCOUNTER — Other Ambulatory Visit: Payer: Self-pay | Admitting: Hematology and Oncology

## 2022-10-14 DIAGNOSIS — D0512 Intraductal carcinoma in situ of left breast: Secondary | ICD-10-CM | POA: Diagnosis not present

## 2022-10-14 DIAGNOSIS — E1169 Type 2 diabetes mellitus with other specified complication: Secondary | ICD-10-CM | POA: Diagnosis not present

## 2022-10-14 DIAGNOSIS — D509 Iron deficiency anemia, unspecified: Secondary | ICD-10-CM | POA: Diagnosis not present

## 2022-10-14 DIAGNOSIS — Z23 Encounter for immunization: Secondary | ICD-10-CM | POA: Diagnosis not present

## 2022-10-14 DIAGNOSIS — E1165 Type 2 diabetes mellitus with hyperglycemia: Secondary | ICD-10-CM | POA: Diagnosis not present

## 2022-10-14 DIAGNOSIS — E7800 Pure hypercholesterolemia, unspecified: Secondary | ICD-10-CM | POA: Diagnosis not present

## 2023-09-22 DIAGNOSIS — E1165 Type 2 diabetes mellitus with hyperglycemia: Secondary | ICD-10-CM | POA: Diagnosis not present

## 2023-09-22 DIAGNOSIS — D509 Iron deficiency anemia, unspecified: Secondary | ICD-10-CM | POA: Diagnosis not present

## 2023-09-22 DIAGNOSIS — Z0000 Encounter for general adult medical examination without abnormal findings: Secondary | ICD-10-CM | POA: Diagnosis not present

## 2023-09-22 DIAGNOSIS — Z853 Personal history of malignant neoplasm of breast: Secondary | ICD-10-CM | POA: Diagnosis not present

## 2023-09-22 DIAGNOSIS — E7800 Pure hypercholesterolemia, unspecified: Secondary | ICD-10-CM | POA: Diagnosis not present

## 2023-09-24 ENCOUNTER — Other Ambulatory Visit: Payer: Self-pay | Admitting: Family Medicine

## 2023-09-24 DIAGNOSIS — Z853 Personal history of malignant neoplasm of breast: Secondary | ICD-10-CM

## 2023-10-01 ENCOUNTER — Other Ambulatory Visit: Payer: Self-pay | Admitting: Hematology and Oncology

## 2023-10-19 ENCOUNTER — Ambulatory Visit
Admission: RE | Admit: 2023-10-19 | Discharge: 2023-10-19 | Disposition: A | Discharge status: Home / Self Care | Payer: HMO | Source: Ambulatory Visit | Attending: Family Medicine | Admitting: Family Medicine

## 2023-10-19 DIAGNOSIS — Z853 Personal history of malignant neoplasm of breast: Secondary | ICD-10-CM

## 2023-10-19 HISTORY — DX: Personal history of irradiation: Z92.3

## 2023-12-29 DIAGNOSIS — D0512 Intraductal carcinoma in situ of left breast: Secondary | ICD-10-CM | POA: Diagnosis not present

## 2023-12-29 DIAGNOSIS — E1165 Type 2 diabetes mellitus with hyperglycemia: Secondary | ICD-10-CM | POA: Diagnosis not present

## 2023-12-29 DIAGNOSIS — E7800 Pure hypercholesterolemia, unspecified: Secondary | ICD-10-CM | POA: Diagnosis not present

## 2023-12-29 DIAGNOSIS — J302 Other seasonal allergic rhinitis: Secondary | ICD-10-CM | POA: Diagnosis not present

## 2024-03-22 DIAGNOSIS — Z853 Personal history of malignant neoplasm of breast: Secondary | ICD-10-CM | POA: Diagnosis not present

## 2024-03-22 DIAGNOSIS — E119 Type 2 diabetes mellitus without complications: Secondary | ICD-10-CM | POA: Diagnosis not present

## 2024-03-22 DIAGNOSIS — E7800 Pure hypercholesterolemia, unspecified: Secondary | ICD-10-CM | POA: Diagnosis not present

## 2024-03-22 DIAGNOSIS — Z6827 Body mass index (BMI) 27.0-27.9, adult: Secondary | ICD-10-CM | POA: Diagnosis not present
